# Patient Record
Sex: Female | Born: 1945 | Race: Black or African American | Hispanic: No | Smoking: Never smoker
Health system: Southern US, Community
[De-identification: ages and names within clinical notes are randomized; demographics above are authoritative.]

## PROBLEM LIST (undated history)

## (undated) DIAGNOSIS — C50919 Malignant neoplasm of unspecified site of unspecified female breast: Secondary | ICD-10-CM

## (undated) DIAGNOSIS — I10 Essential (primary) hypertension: Secondary | ICD-10-CM

## (undated) DIAGNOSIS — Z9221 Personal history of antineoplastic chemotherapy: Secondary | ICD-10-CM

## (undated) DIAGNOSIS — Z923 Personal history of irradiation: Secondary | ICD-10-CM

## (undated) DIAGNOSIS — K5790 Diverticulosis of intestine, part unspecified, without perforation or abscess without bleeding: Secondary | ICD-10-CM

## (undated) DIAGNOSIS — E7800 Pure hypercholesterolemia, unspecified: Secondary | ICD-10-CM

## (undated) DIAGNOSIS — M1990 Unspecified osteoarthritis, unspecified site: Secondary | ICD-10-CM

## (undated) DIAGNOSIS — B351 Tinea unguium: Secondary | ICD-10-CM

## (undated) DIAGNOSIS — C801 Malignant (primary) neoplasm, unspecified: Secondary | ICD-10-CM

## (undated) DIAGNOSIS — D649 Anemia, unspecified: Secondary | ICD-10-CM

## (undated) DIAGNOSIS — E119 Type 2 diabetes mellitus without complications: Secondary | ICD-10-CM

## (undated) HISTORY — PX: ABDOMINAL HYSTERECTOMY: SHX81

## (undated) HISTORY — PX: DILATION AND CURETTAGE, DIAGNOSTIC / THERAPEUTIC: SUR384

---

## 1994-11-01 DIAGNOSIS — Z923 Personal history of irradiation: Secondary | ICD-10-CM

## 1994-11-01 DIAGNOSIS — C801 Malignant (primary) neoplasm, unspecified: Secondary | ICD-10-CM

## 1994-11-01 HISTORY — DX: Personal history of irradiation: Z92.3

## 1994-11-01 HISTORY — PX: MASTECTOMY: SHX3

## 1994-11-01 HISTORY — DX: Malignant (primary) neoplasm, unspecified: C80.1

## 2004-09-23 ENCOUNTER — Ambulatory Visit: Payer: Self-pay | Admitting: Unknown Physician Specialty

## 2004-11-20 ENCOUNTER — Ambulatory Visit: Payer: Self-pay | Admitting: Gastroenterology

## 2005-05-18 ENCOUNTER — Ambulatory Visit: Payer: Self-pay | Admitting: Unknown Physician Specialty

## 2005-09-27 ENCOUNTER — Ambulatory Visit: Payer: Self-pay | Admitting: Unknown Physician Specialty

## 2006-10-03 ENCOUNTER — Ambulatory Visit: Payer: Self-pay | Admitting: Unknown Physician Specialty

## 2007-01-12 ENCOUNTER — Ambulatory Visit: Payer: Self-pay | Admitting: Unknown Physician Specialty

## 2007-06-14 ENCOUNTER — Ambulatory Visit: Payer: Self-pay | Admitting: Obstetrics and Gynecology

## 2007-06-20 ENCOUNTER — Ambulatory Visit: Payer: Self-pay | Admitting: Obstetrics and Gynecology

## 2007-10-18 ENCOUNTER — Ambulatory Visit: Payer: Self-pay | Admitting: Unknown Physician Specialty

## 2008-10-18 ENCOUNTER — Ambulatory Visit: Payer: Self-pay | Admitting: Unknown Physician Specialty

## 2009-09-04 ENCOUNTER — Ambulatory Visit: Payer: Self-pay | Admitting: Unknown Physician Specialty

## 2010-09-07 ENCOUNTER — Ambulatory Visit: Payer: Self-pay | Admitting: Unknown Physician Specialty

## 2011-09-09 ENCOUNTER — Ambulatory Visit: Payer: Self-pay | Admitting: Unknown Physician Specialty

## 2012-09-12 ENCOUNTER — Ambulatory Visit: Payer: Self-pay | Admitting: Unknown Physician Specialty

## 2012-09-25 ENCOUNTER — Ambulatory Visit: Payer: Self-pay | Admitting: Unknown Physician Specialty

## 2012-10-01 ENCOUNTER — Ambulatory Visit: Payer: Self-pay | Admitting: Oncology

## 2012-10-12 ENCOUNTER — Ambulatory Visit: Payer: Self-pay | Admitting: Surgery

## 2012-10-18 ENCOUNTER — Ambulatory Visit: Payer: Self-pay | Admitting: Oncology

## 2012-10-23 ENCOUNTER — Ambulatory Visit: Payer: Self-pay | Admitting: Oncology

## 2012-11-01 ENCOUNTER — Ambulatory Visit: Payer: Self-pay | Admitting: Oncology

## 2012-11-01 DIAGNOSIS — C50919 Malignant neoplasm of unspecified site of unspecified female breast: Secondary | ICD-10-CM

## 2012-11-01 DIAGNOSIS — Z9221 Personal history of antineoplastic chemotherapy: Secondary | ICD-10-CM

## 2012-11-01 HISTORY — DX: Malignant neoplasm of unspecified site of unspecified female breast: C50.919

## 2012-11-01 HISTORY — DX: Personal history of antineoplastic chemotherapy: Z92.21

## 2012-11-01 HISTORY — PX: MASTECTOMY: SHX3

## 2012-11-08 ENCOUNTER — Ambulatory Visit: Payer: Self-pay | Admitting: Surgery

## 2012-11-08 LAB — HEMOGLOBIN: HGB: 11.7 g/dL — ABNORMAL LOW (ref 12.0–16.0)

## 2012-11-15 ENCOUNTER — Ambulatory Visit: Payer: Self-pay | Admitting: Surgery

## 2012-11-17 LAB — PATHOLOGY REPORT

## 2012-12-02 ENCOUNTER — Ambulatory Visit: Payer: Self-pay | Admitting: Oncology

## 2012-12-30 ENCOUNTER — Ambulatory Visit: Payer: Self-pay | Admitting: Oncology

## 2013-01-09 ENCOUNTER — Ambulatory Visit: Payer: Self-pay | Admitting: Surgery

## 2013-01-11 LAB — COMPREHENSIVE METABOLIC PANEL
Albumin: 3.7 g/dL (ref 3.4–5.0)
BUN: 20 mg/dL — ABNORMAL HIGH (ref 7–18)
Bilirubin,Total: 0.5 mg/dL (ref 0.2–1.0)
Chloride: 102 mmol/L (ref 98–107)
Creatinine: 1.29 mg/dL (ref 0.60–1.30)
Glucose: 150 mg/dL — ABNORMAL HIGH (ref 65–99)
SGPT (ALT): 38 U/L (ref 12–78)
Sodium: 141 mmol/L (ref 136–145)
Total Protein: 7.2 g/dL (ref 6.4–8.2)

## 2013-01-11 LAB — CBC CANCER CENTER
Basophil #: 0 x10 3/mm (ref 0.0–0.1)
Eosinophil %: 1.5 %
HGB: 11.6 g/dL — ABNORMAL LOW (ref 12.0–16.0)
Lymphocyte #: 2.8 x10 3/mm (ref 1.0–3.6)
Lymphocyte %: 39.7 %
MCH: 21.2 pg — ABNORMAL LOW (ref 26.0–34.0)
MCHC: 31.2 g/dL — ABNORMAL LOW (ref 32.0–36.0)
MCV: 68 fL — ABNORMAL LOW (ref 80–100)
Monocyte #: 0.6 x10 3/mm (ref 0.2–0.9)
RBC: 5.49 10*6/uL — ABNORMAL HIGH (ref 3.80–5.20)
RDW: 14.6 % — ABNORMAL HIGH (ref 11.5–14.5)

## 2013-01-18 LAB — CBC CANCER CENTER
Basophil #: 0.1 "x10 3/mm "
Basophil %: 0.8 %
Eosinophil #: 0.1 "x10 3/mm "
Eosinophil %: 0.7 %
HCT: 35.1 %
HGB: 10.9 g/dL — ABNORMAL LOW
Lymphocyte %: 17.2 %
Lymphs Abs: 1.7 "x10 3/mm "
MCH: 20.8 pg — ABNORMAL LOW
MCHC: 31.1 g/dL — ABNORMAL LOW
MCV: 67 fL — ABNORMAL LOW
Monocyte #: 0.9 "x10 3/mm "
Monocyte %: 8.9 %
Neutrophil #: 7.4 "x10 3/mm " — ABNORMAL HIGH
Neutrophil %: 72.4 %
Platelet: 290 "x10 3/mm "
RBC: 5.24 "x10 6/mm " — ABNORMAL HIGH
RDW: 14.1 %
WBC: 10.1 "x10 3/mm "

## 2013-01-18 LAB — COMPREHENSIVE METABOLIC PANEL
Alkaline Phosphatase: 109 U/L (ref 50–136)
Anion Gap: 3 — ABNORMAL LOW (ref 7–16)
BUN: 16 mg/dL (ref 7–18)
Bilirubin,Total: 0.5 mg/dL (ref 0.2–1.0)
Chloride: 100 mmol/L (ref 98–107)
Creatinine: 0.95 mg/dL (ref 0.60–1.30)
EGFR (Non-African Amer.): >60
SGPT (ALT): 30 U/L (ref 12–78)
Sodium: 134 mmol/L — ABNORMAL LOW (ref 136–145)
Total Protein: 7.2 g/dL (ref 6.4–8.2)

## 2013-01-25 LAB — CBC CANCER CENTER
Basophil #: 0.1 "x10 3/mm "
Basophil %: 0.5 %
Eosinophil #: 0 "x10 3/mm "
Eosinophil %: 0.3 %
HCT: 36.9 %
HGB: 11.5 g/dL — ABNORMAL LOW
Lymphocyte %: 25.2 %
Lymphs Abs: 2.8 "x10 3/mm "
MCH: 20.9 pg — ABNORMAL LOW
MCHC: 31.3 g/dL — ABNORMAL LOW
MCV: 67 fL — ABNORMAL LOW
Monocyte #: 0.5 "x10 3/mm "
Monocyte %: 4.6 %
Neutrophil #: 7.8 "x10 3/mm " — ABNORMAL HIGH
Neutrophil %: 69.4 %
Platelet: 168 "x10 3/mm "
RBC: 5.53 "x10 6/mm " — ABNORMAL HIGH
RDW: 13.9 %
WBC: 11.2 "x10 3/mm " — ABNORMAL HIGH

## 2013-01-30 ENCOUNTER — Ambulatory Visit: Payer: Self-pay | Admitting: Oncology

## 2013-02-01 LAB — CBC CANCER CENTER
Basophil %: 0.9 %
Eosinophil #: 0 x10 3/mm (ref 0.0–0.7)
Eosinophil %: 0.3 %
HCT: 33.3 % — ABNORMAL LOW (ref 35.0–47.0)
HGB: 10.2 g/dL — ABNORMAL LOW (ref 12.0–16.0)
Lymphocyte %: 36.9 %
MCHC: 30.6 g/dL — ABNORMAL LOW (ref 32.0–36.0)
MCV: 68 fL — ABNORMAL LOW (ref 80–100)
Monocyte #: 0.5 x10 3/mm (ref 0.2–0.9)
Monocyte %: 8.6 %
Platelet: 378 x10 3/mm (ref 150–440)
RBC: 4.93 10*6/uL (ref 3.80–5.20)
RDW: 14.3 % (ref 11.5–14.5)

## 2013-02-01 LAB — COMPREHENSIVE METABOLIC PANEL
Albumin: 3.3 g/dL — ABNORMAL LOW (ref 3.4–5.0)
Alkaline Phosphatase: 87 U/L (ref 50–136)
BUN: 21 mg/dL — ABNORMAL HIGH (ref 7–18)
Chloride: 101 mmol/L (ref 98–107)
Creatinine: 1.28 mg/dL (ref 0.60–1.30)
EGFR (African American): 51 — ABNORMAL LOW
EGFR (Non-African Amer.): 44 — ABNORMAL LOW
Osmolality: 291 (ref 275–301)
Potassium: 3.5 mmol/L (ref 3.5–5.1)
Sodium: 140 mmol/L (ref 136–145)

## 2013-02-08 LAB — CBC CANCER CENTER
HCT: 33.4 % — ABNORMAL LOW (ref 35.0–47.0)
MCH: 20.2 pg — ABNORMAL LOW (ref 26.0–34.0)
MCV: 66 fL — ABNORMAL LOW (ref 80–100)
Metamyelocyte: 4 %
Myelocyte: 3 %
NRBC/100 WBC: 2 /100
Other Cells Blood: 2 %
Promyelocyte: 2 %
RBC: 5.03 10*6/uL (ref 3.80–5.20)
Segmented Neutrophils: 42 %
WBC: 14.9 x10 3/mm — ABNORMAL HIGH (ref 3.6–11.0)

## 2013-02-15 LAB — CBC CANCER CENTER
Basophil #: 0 x10 3/mm (ref 0.0–0.1)
Basophil %: 0.3 %
Eosinophil #: 0 x10 3/mm (ref 0.0–0.7)
HCT: 33.9 % — ABNORMAL LOW (ref 35.0–47.0)
Lymphocyte %: 20.8 %
MCHC: 31.2 g/dL — ABNORMAL LOW (ref 32.0–36.0)
MCV: 67 fL — ABNORMAL LOW (ref 80–100)
Monocyte %: 4.3 %
Neutrophil #: 7.6 x10 3/mm — ABNORMAL HIGH (ref 1.4–6.5)
Neutrophil %: 74.3 %
RBC: 5.05 10*6/uL (ref 3.80–5.20)
RDW: 14.9 % — ABNORMAL HIGH (ref 11.5–14.5)
WBC: 10.2 x10 3/mm (ref 3.6–11.0)

## 2013-02-22 LAB — COMPREHENSIVE METABOLIC PANEL
Alkaline Phosphatase: 94 U/L (ref 50–136)
Anion Gap: 11 (ref 7–16)
BUN: 14 mg/dL (ref 7–18)
Bilirubin,Total: 0.4 mg/dL (ref 0.2–1.0)
Calcium, Total: 8.9 mg/dL (ref 8.5–10.1)
Co2: 28 mmol/L (ref 21–32)
Creatinine: 1.14 mg/dL (ref 0.60–1.30)
EGFR (African American): 59 — ABNORMAL LOW
EGFR (Non-African Amer.): 51 — ABNORMAL LOW
Osmolality: 286 (ref 275–301)
Potassium: 3.3 mmol/L — ABNORMAL LOW (ref 3.5–5.1)
SGOT(AST): 12 U/L — ABNORMAL LOW (ref 15–37)
Sodium: 140 mmol/L (ref 136–145)
Total Protein: 6.4 g/dL (ref 6.4–8.2)

## 2013-02-22 LAB — CBC CANCER CENTER
Basophil %: 0.5 %
Eosinophil %: 0.3 %
HGB: 10 g/dL — ABNORMAL LOW (ref 12.0–16.0)
Lymphocyte %: 26.7 %
MCV: 67 fL — ABNORMAL LOW (ref 80–100)
Monocyte %: 8.1 %
Neutrophil #: 4.3 x10 3/mm (ref 1.4–6.5)
Neutrophil %: 64.4 %
RBC: 4.71 10*6/uL (ref 3.80–5.20)
RDW: 14.9 % — ABNORMAL HIGH (ref 11.5–14.5)
WBC: 6.6 x10 3/mm (ref 3.6–11.0)

## 2013-03-01 ENCOUNTER — Ambulatory Visit: Payer: Self-pay | Admitting: Oncology

## 2013-03-01 LAB — CBC CANCER CENTER
Eosinophil %: 0.8 %
Lymphocyte %: 18.7 %
MCHC: 30.5 g/dL — ABNORMAL LOW (ref 32.0–36.0)
MCV: 67 fL — ABNORMAL LOW (ref 80–100)
Neutrophil #: 8.4 x10 3/mm — ABNORMAL HIGH (ref 1.4–6.5)
Platelet: 362 x10 3/mm (ref 150–440)
RBC: 4.55 10*6/uL (ref 3.80–5.20)
WBC: 11.7 x10 3/mm — ABNORMAL HIGH (ref 3.6–11.0)

## 2013-03-08 LAB — CBC CANCER CENTER
Basophil #: 0 x10 3/mm (ref 0.0–0.1)
Eosinophil %: 0.4 %
Lymphocyte #: 2.2 x10 3/mm (ref 1.0–3.6)
Lymphocyte %: 17.7 %
MCH: 20.7 pg — ABNORMAL LOW (ref 26.0–34.0)
MCV: 68 fL — ABNORMAL LOW (ref 80–100)
Monocyte #: 0.5 x10 3/mm (ref 0.2–0.9)
Monocyte %: 4.2 %
Neutrophil #: 9.8 x10 3/mm — ABNORMAL HIGH (ref 1.4–6.5)
Neutrophil %: 77.4 %
RBC: 4.83 10*6/uL (ref 3.80–5.20)
WBC: 12.7 x10 3/mm — ABNORMAL HIGH (ref 3.6–11.0)

## 2013-03-15 LAB — CBC CANCER CENTER
Basophil #: 0 x10 3/mm (ref 0.0–0.1)
Basophil %: 0.7 %
Eosinophil #: 0 x10 3/mm (ref 0.0–0.7)
Eosinophil %: 0.6 %
HCT: 31.2 % — ABNORMAL LOW (ref 35.0–47.0)
HGB: 9.8 g/dL — ABNORMAL LOW (ref 12.0–16.0)
Lymphocyte #: 1.7 x10 3/mm (ref 1.0–3.6)
Lymphocyte %: 23.2 %
MCH: 21.2 pg — ABNORMAL LOW (ref 26.0–34.0)
MCV: 68 fL — ABNORMAL LOW (ref 80–100)
Monocyte %: 10.4 %
Neutrophil #: 4.9 x10 3/mm (ref 1.4–6.5)
Neutrophil %: 65.1 %
Platelet: 300 x10 3/mm (ref 150–440)
RBC: 4.62 10*6/uL (ref 3.80–5.20)
RDW: 16.7 % — ABNORMAL HIGH (ref 11.5–14.5)
WBC: 7.5 x10 3/mm (ref 3.6–11.0)

## 2013-03-15 LAB — COMPREHENSIVE METABOLIC PANEL
Albumin: 3.4 g/dL (ref 3.4–5.0)
Alkaline Phosphatase: 103 U/L (ref 50–136)
Anion Gap: 9 (ref 7–16)
BUN: 14 mg/dL (ref 7–18)
Chloride: 102 mmol/L (ref 98–107)
Co2: 30 mmol/L (ref 21–32)
Creatinine: 1.04 mg/dL (ref 0.60–1.30)
Glucose: 121 mg/dL — ABNORMAL HIGH (ref 65–99)
Potassium: 3.6 mmol/L (ref 3.5–5.1)
SGOT(AST): 11 U/L — ABNORMAL LOW (ref 15–37)
SGPT (ALT): 22 U/L (ref 12–78)
Sodium: 141 mmol/L (ref 136–145)
Total Protein: 6.8 g/dL (ref 6.4–8.2)

## 2013-03-20 LAB — CBC CANCER CENTER
Basophil %: 0.6 %
Eosinophil %: 0.5 %
HCT: 29.9 % — ABNORMAL LOW (ref 35.0–47.0)
HGB: 9.4 g/dL — ABNORMAL LOW (ref 12.0–16.0)
Lymphocyte %: 13.7 %
MCH: 21.1 pg — ABNORMAL LOW (ref 26.0–34.0)
MCV: 67 fL — ABNORMAL LOW (ref 80–100)
Monocyte #: 0.4 x10 3/mm (ref 0.2–0.9)
Monocyte %: 3.9 %
Neutrophil #: 7.4 x10 3/mm — ABNORMAL HIGH (ref 1.4–6.5)
Neutrophil %: 81.3 %
Platelet: 292 x10 3/mm (ref 150–440)
RBC: 4.47 10*6/uL (ref 3.80–5.20)
WBC: 9.1 x10 3/mm (ref 3.6–11.0)

## 2013-03-29 LAB — CBC CANCER CENTER
Basophil #: 0.1 x10 3/mm (ref 0.0–0.1)
Basophil %: 0.6 %
Eosinophil #: 0.1 x10 3/mm (ref 0.0–0.7)
Eosinophil %: 0.4 %
HCT: 31 % — ABNORMAL LOW (ref 35.0–47.0)
HGB: 9.5 g/dL — ABNORMAL LOW (ref 12.0–16.0)
Lymphocyte #: 2.3 x10 3/mm (ref 1.0–3.6)
Lymphocyte %: 19.1 %
MCV: 68 fL — ABNORMAL LOW (ref 80–100)
Monocyte #: 0.7 x10 3/mm (ref 0.2–0.9)
Monocyte %: 6 %
Neutrophil #: 8.9 x10 3/mm — ABNORMAL HIGH (ref 1.4–6.5)
Neutrophil %: 73.9 %
RBC: 4.59 10*6/uL (ref 3.80–5.20)
RDW: 17.9 % — ABNORMAL HIGH (ref 11.5–14.5)

## 2013-04-01 ENCOUNTER — Ambulatory Visit: Payer: Self-pay | Admitting: Oncology

## 2013-04-05 LAB — CBC CANCER CENTER
Basophil #: 0.1 x10 3/mm (ref 0.0–0.1)
Basophil %: 1.1 %
Eosinophil #: 0 x10 3/mm (ref 0.0–0.7)
Eosinophil %: 0.5 %
HCT: 29.1 % — ABNORMAL LOW (ref 35.0–47.0)
HGB: 9.2 g/dL — ABNORMAL LOW (ref 12.0–16.0)
MCV: 68 fL — ABNORMAL LOW (ref 80–100)
Neutrophil #: 4.5 x10 3/mm (ref 1.4–6.5)
RBC: 4.3 10*6/uL (ref 3.80–5.20)
WBC: 7.3 x10 3/mm (ref 3.6–11.0)

## 2013-04-05 LAB — COMPREHENSIVE METABOLIC PANEL
Albumin: 3.3 g/dL — ABNORMAL LOW (ref 3.4–5.0)
Alkaline Phosphatase: 119 U/L (ref 50–136)
Anion Gap: 12 (ref 7–16)
BUN: 18 mg/dL (ref 7–18)
Bilirubin,Total: 0.3 mg/dL (ref 0.2–1.0)
Co2: 27 mmol/L (ref 21–32)
Creatinine: 1.11 mg/dL (ref 0.60–1.30)
EGFR (Non-African Amer.): 52 — ABNORMAL LOW
Osmolality: 280 (ref 275–301)
Potassium: 3.4 mmol/L — ABNORMAL LOW (ref 3.5–5.1)
SGOT(AST): 13 U/L — ABNORMAL LOW (ref 15–37)
Sodium: 138 mmol/L (ref 136–145)
Total Protein: 6.9 g/dL (ref 6.4–8.2)

## 2013-04-26 LAB — CBC CANCER CENTER
Basophil #: 0.1 x10 3/mm (ref 0.0–0.1)
Eosinophil #: 0 x10 3/mm (ref 0.0–0.7)
HGB: 9.6 g/dL — ABNORMAL LOW (ref 12.0–16.0)
Lymphocyte #: 2.5 x10 3/mm (ref 1.0–3.6)
MCHC: 31.4 g/dL — ABNORMAL LOW (ref 32.0–36.0)
Monocyte %: 12.2 %
Neutrophil %: 56.8 %

## 2013-04-26 LAB — COMPREHENSIVE METABOLIC PANEL
Albumin: 3.3 g/dL — ABNORMAL LOW (ref 3.4–5.0)
Alkaline Phosphatase: 119 U/L (ref 50–136)
Anion Gap: 9 (ref 7–16)
BUN: 17 mg/dL (ref 7–18)
Bilirubin,Total: 0.5 mg/dL (ref 0.2–1.0)
Calcium, Total: 9.7 mg/dL (ref 8.5–10.1)
Chloride: 103 mmol/L (ref 98–107)
Co2: 28 mmol/L (ref 21–32)
EGFR (African American): >60
EGFR (Non-African Amer.): 57 — ABNORMAL LOW
Glucose: 121 mg/dL — ABNORMAL HIGH (ref 65–99)
Osmolality: 282 (ref 275–301)
Potassium: 3.9 mmol/L (ref 3.5–5.1)
SGOT(AST): 13 U/L — ABNORMAL LOW (ref 15–37)
Sodium: 140 mmol/L (ref 136–145)
Total Protein: 6.9 g/dL (ref 6.4–8.2)

## 2013-05-01 ENCOUNTER — Ambulatory Visit: Payer: Self-pay | Admitting: Oncology

## 2013-06-01 ENCOUNTER — Ambulatory Visit: Payer: Self-pay | Admitting: Oncology

## 2013-07-02 ENCOUNTER — Ambulatory Visit: Payer: Self-pay | Admitting: Oncology

## 2013-07-30 LAB — CBC CANCER CENTER
Basophil #: 0 x10 3/mm (ref 0.0–0.1)
Basophil %: 0.8 %
Lymphocyte #: 2.1 x10 3/mm (ref 1.0–3.6)
Lymphocyte %: 41.1 %
MCH: 21.1 pg — ABNORMAL LOW (ref 26.0–34.0)
MCHC: 31.2 g/dL — ABNORMAL LOW (ref 32.0–36.0)
MCV: 68 fL — ABNORMAL LOW (ref 80–100)
Monocyte #: 0.4 x10 3/mm (ref 0.2–0.9)
Monocyte %: 7.5 %
Neutrophil #: 2.4 x10 3/mm (ref 1.4–6.5)
Neutrophil %: 48.1 %
Platelet: 346 x10 3/mm (ref 150–440)
RBC: 5.51 10*6/uL — ABNORMAL HIGH (ref 3.80–5.20)
WBC: 5 x10 3/mm (ref 3.6–11.0)

## 2013-07-30 LAB — COMPREHENSIVE METABOLIC PANEL
Albumin: 3.8 g/dL (ref 3.4–5.0)
Alkaline Phosphatase: 105 U/L (ref 50–136)
BUN: 20 mg/dL — ABNORMAL HIGH (ref 7–18)
Calcium, Total: 10.1 mg/dL (ref 8.5–10.1)
Co2: 32 mmol/L (ref 21–32)
Creatinine: 1.07 mg/dL (ref 0.60–1.30)
EGFR (African American): >60
Glucose: 130 mg/dL — ABNORMAL HIGH (ref 65–99)
Potassium: 3.9 mmol/L (ref 3.5–5.1)
SGOT(AST): 19 U/L (ref 15–37)
Sodium: 142 mmol/L (ref 136–145)
Total Protein: 7.2 g/dL (ref 6.4–8.2)

## 2013-08-01 ENCOUNTER — Ambulatory Visit: Payer: Self-pay | Admitting: Oncology

## 2013-09-10 ENCOUNTER — Ambulatory Visit: Payer: Self-pay | Admitting: Oncology

## 2013-09-13 ENCOUNTER — Ambulatory Visit: Payer: Self-pay | Admitting: Surgery

## 2013-10-01 ENCOUNTER — Ambulatory Visit: Payer: Self-pay | Admitting: Oncology

## 2013-11-05 ENCOUNTER — Ambulatory Visit: Payer: Self-pay | Admitting: Oncology

## 2013-11-05 LAB — CBC CANCER CENTER
BASOS ABS: 0 x10 3/mm (ref 0.0–0.1)
Basophil %: 0.6 %
Eosinophil #: 0.2 x10 3/mm (ref 0.0–0.7)
Eosinophil %: 3.3 %
HCT: 37.1 % (ref 35.0–47.0)
HGB: 11.4 g/dL — ABNORMAL LOW (ref 12.0–16.0)
LYMPHS ABS: 2.3 x10 3/mm (ref 1.0–3.6)
LYMPHS PCT: 38.5 %
MCH: 20.8 pg — AB (ref 26.0–34.0)
MCHC: 30.8 g/dL — AB (ref 32.0–36.0)
MCV: 67 fL — ABNORMAL LOW (ref 80–100)
MONO ABS: 0.4 x10 3/mm (ref 0.2–0.9)
Monocyte %: 7.1 %
NEUTROS ABS: 3 x10 3/mm (ref 1.4–6.5)
NEUTROS PCT: 50.5 %
PLATELETS: 383 x10 3/mm (ref 150–440)
RBC: 5.51 10*6/uL — ABNORMAL HIGH (ref 3.80–5.20)
RDW: 16.6 % — AB (ref 11.5–14.5)
WBC: 5.9 x10 3/mm (ref 3.6–11.0)

## 2013-11-05 LAB — COMPREHENSIVE METABOLIC PANEL
ANION GAP: 10 (ref 7–16)
Albumin: 3.6 g/dL (ref 3.4–5.0)
Alkaline Phosphatase: 111 U/L
BUN: 16 mg/dL (ref 7–18)
Bilirubin,Total: 0.5 mg/dL (ref 0.2–1.0)
CALCIUM: 9.7 mg/dL (ref 8.5–10.1)
CHLORIDE: 101 mmol/L (ref 98–107)
CREATININE: 1.12 mg/dL (ref 0.60–1.30)
Co2: 28 mmol/L (ref 21–32)
EGFR (African American): 60 — ABNORMAL LOW
EGFR (Non-African Amer.): 52 — ABNORMAL LOW
Glucose: 228 mg/dL — ABNORMAL HIGH (ref 65–99)
OSMOLALITY: 286 (ref 275–301)
POTASSIUM: 3.9 mmol/L (ref 3.5–5.1)
SGOT(AST): 14 U/L — ABNORMAL LOW (ref 15–37)
SGPT (ALT): 43 U/L (ref 12–78)
Sodium: 139 mmol/L (ref 136–145)
TOTAL PROTEIN: 7.1 g/dL (ref 6.4–8.2)

## 2013-11-06 LAB — CANCER ANTIGEN 27.29: CA 27.29: 14.3 U/mL (ref 0.0–38.6)

## 2013-12-02 ENCOUNTER — Ambulatory Visit: Payer: Self-pay | Admitting: Oncology

## 2013-12-30 ENCOUNTER — Ambulatory Visit: Payer: Self-pay | Admitting: Oncology

## 2014-01-31 ENCOUNTER — Ambulatory Visit: Payer: Self-pay | Admitting: Oncology

## 2014-03-01 ENCOUNTER — Ambulatory Visit: Payer: Self-pay | Admitting: Oncology

## 2014-04-01 ENCOUNTER — Ambulatory Visit: Payer: Self-pay | Admitting: Oncology

## 2014-05-01 ENCOUNTER — Ambulatory Visit: Payer: Self-pay | Admitting: Oncology

## 2014-05-01 ENCOUNTER — Ambulatory Visit: Payer: Self-pay | Admitting: Hematology and Oncology

## 2014-05-06 LAB — CBC CANCER CENTER
BASOS PCT: 0.3 %
Basophil #: 0 x10 3/mm (ref 0.0–0.1)
EOS PCT: 1.6 %
Eosinophil #: 0.1 x10 3/mm (ref 0.0–0.7)
HCT: 36.8 % (ref 35.0–47.0)
HGB: 11.4 g/dL — ABNORMAL LOW (ref 12.0–16.0)
LYMPHS ABS: 1.9 x10 3/mm (ref 1.0–3.6)
LYMPHS PCT: 28.2 %
MCH: 21.3 pg — ABNORMAL LOW (ref 26.0–34.0)
MCHC: 30.9 g/dL — ABNORMAL LOW (ref 32.0–36.0)
MCV: 69 fL — ABNORMAL LOW (ref 80–100)
MONOS PCT: 5.5 %
Monocyte #: 0.4 x10 3/mm (ref 0.2–0.9)
NEUTROS PCT: 64.4 %
Neutrophil #: 4.4 x10 3/mm (ref 1.4–6.5)
Platelet: 322 x10 3/mm (ref 150–440)
RBC: 5.33 10*6/uL — ABNORMAL HIGH (ref 3.80–5.20)
RDW: 15.1 % — ABNORMAL HIGH (ref 11.5–14.5)
WBC: 6.9 x10 3/mm (ref 3.6–11.0)

## 2014-05-06 LAB — COMPREHENSIVE METABOLIC PANEL
ANION GAP: 7 (ref 7–16)
Albumin: 3.7 g/dL (ref 3.4–5.0)
Alkaline Phosphatase: 102 U/L
BUN: 21 mg/dL — AB (ref 7–18)
Bilirubin,Total: 0.7 mg/dL (ref 0.2–1.0)
CALCIUM: 9.7 mg/dL (ref 8.5–10.1)
CHLORIDE: 104 mmol/L (ref 98–107)
Co2: 30 mmol/L (ref 21–32)
Creatinine: 1.04 mg/dL (ref 0.60–1.30)
EGFR (African American): >60
GFR CALC NON AF AMER: 56 — AB
GLUCOSE: 184 mg/dL — AB (ref 65–99)
Osmolality: 289 (ref 275–301)
POTASSIUM: 4.4 mmol/L (ref 3.5–5.1)
SGOT(AST): 21 U/L (ref 15–37)
SGPT (ALT): 63 U/L (ref 12–78)
Sodium: 141 mmol/L (ref 136–145)
Total Protein: 7.3 g/dL (ref 6.4–8.2)

## 2014-06-01 ENCOUNTER — Ambulatory Visit: Payer: Self-pay | Admitting: Hematology and Oncology

## 2014-06-01 ENCOUNTER — Ambulatory Visit: Payer: Self-pay | Admitting: Oncology

## 2014-07-02 ENCOUNTER — Ambulatory Visit: Payer: Self-pay | Admitting: Oncology

## 2014-07-31 DIAGNOSIS — R740 Nonspecific elevation of levels of transaminase and lactic acid dehydrogenase [LDH]: Secondary | ICD-10-CM

## 2014-07-31 DIAGNOSIS — I10 Essential (primary) hypertension: Secondary | ICD-10-CM | POA: Insufficient documentation

## 2014-07-31 DIAGNOSIS — R7401 Elevation of levels of liver transaminase levels: Secondary | ICD-10-CM | POA: Insufficient documentation

## 2014-07-31 DIAGNOSIS — IMO0002 Reserved for concepts with insufficient information to code with codable children: Secondary | ICD-10-CM | POA: Insufficient documentation

## 2014-08-01 ENCOUNTER — Ambulatory Visit: Payer: Self-pay | Admitting: Oncology

## 2014-08-06 ENCOUNTER — Ambulatory Visit: Payer: Self-pay | Admitting: Internal Medicine

## 2014-09-03 ENCOUNTER — Ambulatory Visit: Payer: Self-pay | Admitting: Oncology

## 2014-09-19 ENCOUNTER — Ambulatory Visit: Payer: Self-pay | Admitting: Internal Medicine

## 2014-10-01 ENCOUNTER — Ambulatory Visit: Payer: Self-pay | Admitting: Oncology

## 2014-11-01 ENCOUNTER — Ambulatory Visit: Payer: Self-pay | Admitting: Oncology

## 2014-12-12 DIAGNOSIS — E782 Mixed hyperlipidemia: Secondary | ICD-10-CM | POA: Insufficient documentation

## 2015-01-07 ENCOUNTER — Ambulatory Visit
Admit: 2015-01-07 | Disposition: A | Discharge status: Home / Self Care | Payer: Self-pay | Attending: Oncology | Admitting: Oncology

## 2015-01-31 ENCOUNTER — Ambulatory Visit
Admit: 2015-01-31 | Disposition: A | Discharge status: Home / Self Care | Payer: Self-pay | Attending: Oncology | Admitting: Oncology

## 2015-02-21 NOTE — Op Note (Signed)
PATIENT NAME:  Nancy Gaines, Nancy Gaines MR#:  [id number] DATE OF BIRTH:  09/29/1946  DATE OF PROCEDURE:  01/09/2013  PREOPERATIVE DIAGNOSIS:  Breast cancer.   POSTOPERATIVE DIAGNOSIS:  Breast cancer.   PROCEDURE PERFORMED:  Insertion of central venous catheter with subcutaneous infusion port.   SURGEON:  Rochel Brome  ANESTHESIA:  Local 1% Xylocaine with monitored anesthesia care.   INDICATIONS:  This 69 year old female has history of cancer of the right breast, now needing central venous access for chemotherapy. The patient was placed on the operating table in the supine position and sedated. A rolled sheet was placed behind her shoulder blades. The neck was extended. The neck and left subclavian areas were prepared with ChloraPrep, draped in a sterile manner.   The skin beneath the clavicle was infiltrated with 1% Xylocaine. A transversely oriented 3 cm incision was made, carried down through subcutaneous tissues and made a subcutaneous pouch inferior to the incision large enough to admit the port.   The ultrasound was used to demonstrate the left jugular vein and carotid and thyroid lobe. The skin overlying the jugular vein was infiltrated with 1% Xylocaine. A transversely oriented 6 mm incision was made, carried down through subcutaneous tissues.  Next, with the patient in the Trendelenburg position using ultrasound guidance, a needle was inserted into the jugular vein; however, at first, the catheter would not thread and had to make additional sticks and subsequently threaded the catheter down into the central circulation. The patient did have some momentary ventricular ectopy but then the catheter was pulled back and the ectopy resolved. The ultrasound image of the vein was recorded for the paper chart.  Next, fluoroscopy was used to demonstrate the location of the wire in the vena cava. The dilator and introducer sheath were advanced over the guidewire. The dilator and guidewire were removed. The  catheter was advanced down through the sheath and the sheath was pulled away. The tip of the catheter was positioned in the superior vena cava as demonstrated with fluoroscopy and a fluoroscopic image was saved for the paper chart.  Next, the catheter was tunneled down to the subclavian incision and cut to fit and attached to the PFM port with the accompanying sleeve. The port was accessed and aspirated a trace of blood and flushed with heparinized saline solution. The port was placed into the subcutaneous pouch and sutured to the surrounding fat with 3-0 silk.  Next, the pouch was closed with 5-0 Vicryl sutures. Both skin incisions were closed with 5-0 Vicryl subcuticular suture and Dermabond. The patient tolerated the procedure satisfactorily and was then prepared for transfer to the recovery room.    ____________________________ Lenna Sciara. Rochel Brome, MD jws:ce D: 01/09/2013 16:52:40 ET T: 01/09/2013 17:31:18 ET JOB#: [id number]  cc: Loreli Dollar, MD, <Dictator> Loreli Dollar MD ELECTRONICALLY SIGNED 01/09/2013 19:07

## 2015-02-21 NOTE — Op Note (Signed)
PATIENT NAME:  Nancy Gaines, Nancy Gaines MR#:  [id number] DATE OF BIRTH:  23-Mar-1946  DATE OF PROCEDURE:  11/15/2012  PREOPERATIVE DIAGNOSIS: Carcinoma of the right breast.   POSTOPERATIVE DIAGNOSIS: Carcinoma of the right breast.   PROCEDURE: Right simple mastectomy.   ANESTHESIA: General.   INDICATIONS: This 69 year old female has a past history of carcinoma of the right breast with partial mastectomy, sentinel lymph node biopsy and radiation therapy. Recently had a mammogram depicting a mass in the upper central aspect of right breast. Biopsy demonstrated infiltrating carcinoma and surgery was recommended for definitive treatment. She did have a preoperative injection of radioactive technetium sulfur colloid. I reviewed the scan, which demonstrated some activity adjacent to the shield but did not positively identify an axillary node.   DESCRIPTION OF PROCEDURE: The patient was placed on the operating table in the supine position under general anesthesia. Both arms were placed on a lateral arm rest. The right breast and surrounding chest wall and upper arm were prepared with Chloraprep and draped in a sterile manner.   Initial inspection revealed there was no palpable mass in the axilla. Gamma counter was used in the axilla and did not identify any axillary activity. Next, a curvilinear incision was made from inferior medial to superior lateral above and below the breast. The dissection was carried down into subcutaneous tissues. Numerous small bleeding points were cauterized. 3-0 silk sutures were used for traction of the skin and subcutaneous flaps. Flaps were raised superiorly in the direction of the clavicle, medially to the sternum, laterally to the latissimus dorsi muscle and inferiorly to the inframammary fold. Next, the breast was elevated off the underlying deep fascia with electrocautery. Several vessels were suture ligated with 4-0 chromic. The dissection was carried out to include the axillary  tail. Next, the gamma probe was again used in the axilla and demonstrated no radioactivity in the axilla. The axilla was further palpated and identified no palpable mass, no adenopathy. The simple mastectomy was completed and specimen submitted for routine pathology. The wound was inspected. Several small bleeding points were cauterized. Hemostasis was subsequently intact. Next, 2 Blake drains were inserted and brought out through separate inferior stab wounds. One was placed up into the axilla and one along the anterior chest wall. These drains were secured to the skin with 3-0 nylon suture. There was some mild redundancy of skin remaining, but it did not appear appropriate to remove any wedges of skin. Concern is irradiated tissue. Next, the wound was closed with a running 4-0 Monocryl subcuticular suture and Dermabond, which was allowed to dry, then the drains were activated. The drain sites were dressed with benzoin and Tegaderm. The drains were also secured with benzoin and 2-inch silk tape. The drains were activated. There was minimal serosanguineous drainage.   The patient tolerated the procedure satisfactorily and was then moved to the recovery room for postoperative care.   ____________________________ J. Rochel Brome, MD jws:jm D: 11/15/2012 14:56:03 ET T: 11/15/2012 18:49:10 ET JOB#: [id number]  cc: Loreli Dollar, MD, <Dictator> Loreli Dollar MD ELECTRONICALLY SIGNED 11/17/2012 16:56

## 2015-03-13 DIAGNOSIS — IMO0002 Reserved for concepts with insufficient information to code with codable children: Secondary | ICD-10-CM | POA: Insufficient documentation

## 2015-03-13 DIAGNOSIS — E119 Type 2 diabetes mellitus without complications: Secondary | ICD-10-CM | POA: Insufficient documentation

## 2015-03-13 DIAGNOSIS — E1165 Type 2 diabetes mellitus with hyperglycemia: Secondary | ICD-10-CM | POA: Insufficient documentation

## 2015-03-14 ENCOUNTER — Other Ambulatory Visit: Payer: Self-pay | Admitting: Internal Medicine

## 2015-03-14 DIAGNOSIS — R748 Abnormal levels of other serum enzymes: Secondary | ICD-10-CM

## 2015-03-19 ENCOUNTER — Ambulatory Visit
Admission: RE | Admit: 2015-03-19 | Discharge: 2015-03-19 | Disposition: A | Discharge status: Home / Self Care | Payer: Medicare Other | Source: Ambulatory Visit | Attending: Internal Medicine | Admitting: Internal Medicine

## 2015-03-19 DIAGNOSIS — R748 Abnormal levels of other serum enzymes: Secondary | ICD-10-CM | POA: Diagnosis present

## 2015-03-19 DIAGNOSIS — R160 Hepatomegaly, not elsewhere classified: Secondary | ICD-10-CM | POA: Diagnosis not present

## 2015-04-01 ENCOUNTER — Telehealth: Payer: Self-pay | Admitting: *Deleted

## 2015-04-01 ENCOUNTER — Inpatient Hospital Stay: Payer: Medicare Other | Attending: Oncology

## 2015-04-01 DIAGNOSIS — C50919 Malignant neoplasm of unspecified site of unspecified female breast: Secondary | ICD-10-CM | POA: Insufficient documentation

## 2015-04-01 DIAGNOSIS — C801 Malignant (primary) neoplasm, unspecified: Secondary | ICD-10-CM

## 2015-04-01 DIAGNOSIS — Z452 Encounter for adjustment and management of vascular access device: Secondary | ICD-10-CM | POA: Insufficient documentation

## 2015-04-01 MED ORDER — SODIUM CHLORIDE 0.9 % IJ SOLN
10.0000 mL | INTRAMUSCULAR | Status: DC | PRN
  Administered 2015-04-01: 10 mL via INTRAVENOUS
  Filled 2015-04-01: qty 10

## 2015-04-01 MED ORDER — LIDOCAINE-PRILOCAINE 2.5-2.5 % EX CREA: 1.0000 "application " | TOPICAL_CREAM | CUTANEOUS | Status: DC | PRN

## 2015-04-01 MED ORDER — HEPARIN SOD (PORK) LOCK FLUSH 100 UNIT/ML IV SOLN
500.0000 [IU] | Freq: Once | INTRAVENOUS | Status: AC
  Administered 2015-04-01: 500 [IU] via INTRAVENOUS
  Filled 2015-04-01: qty 5

## 2015-04-01 NOTE — Telephone Encounter (Signed)
Prescription for EMLA cream called into pharmacy.

## 2015-04-16 ENCOUNTER — Other Ambulatory Visit: Payer: Self-pay | Admitting: Oncology

## 2015-05-13 ENCOUNTER — Inpatient Hospital Stay: Payer: Medicare Other | Attending: Oncology

## 2015-05-13 DIAGNOSIS — Z170 Estrogen receptor positive status [ER+]: Secondary | ICD-10-CM | POA: Insufficient documentation

## 2015-05-13 DIAGNOSIS — C50911 Malignant neoplasm of unspecified site of right female breast: Secondary | ICD-10-CM | POA: Insufficient documentation

## 2015-05-13 DIAGNOSIS — C801 Malignant (primary) neoplasm, unspecified: Secondary | ICD-10-CM

## 2015-05-13 DIAGNOSIS — Z452 Encounter for adjustment and management of vascular access device: Secondary | ICD-10-CM | POA: Insufficient documentation

## 2015-05-13 MED ORDER — SODIUM CHLORIDE 0.9 % IJ SOLN
10.0000 mL | INTRAMUSCULAR | Status: DC | PRN
  Administered 2015-05-13: 10 mL via INTRAVENOUS
  Filled 2015-05-13: qty 10

## 2015-05-13 MED ORDER — HEPARIN SOD (PORK) LOCK FLUSH 100 UNIT/ML IV SOLN
500.0000 [IU] | Freq: Once | INTRAVENOUS | Status: AC
  Administered 2015-05-13: 500 [IU] via INTRAVENOUS
  Filled 2015-05-13: qty 5

## 2015-05-21 ENCOUNTER — Other Ambulatory Visit: Payer: Self-pay | Admitting: Oncology

## 2015-06-24 ENCOUNTER — Inpatient Hospital Stay: Payer: Medicare Other | Attending: Oncology

## 2015-06-24 ENCOUNTER — Telehealth: Payer: Self-pay | Admitting: *Deleted

## 2015-06-24 ENCOUNTER — Encounter (INDEPENDENT_AMBULATORY_CARE_PROVIDER_SITE_OTHER): Payer: Self-pay

## 2015-06-24 DIAGNOSIS — Z170 Estrogen receptor positive status [ER+]: Secondary | ICD-10-CM | POA: Diagnosis not present

## 2015-06-24 DIAGNOSIS — C50919 Malignant neoplasm of unspecified site of unspecified female breast: Secondary | ICD-10-CM

## 2015-06-24 DIAGNOSIS — C801 Malignant (primary) neoplasm, unspecified: Secondary | ICD-10-CM

## 2015-06-24 DIAGNOSIS — C50911 Malignant neoplasm of unspecified site of right female breast: Secondary | ICD-10-CM | POA: Insufficient documentation

## 2015-06-24 DIAGNOSIS — Z452 Encounter for adjustment and management of vascular access device: Secondary | ICD-10-CM | POA: Diagnosis not present

## 2015-06-24 MED ORDER — HEPARIN SOD (PORK) LOCK FLUSH 100 UNIT/ML IV SOLN
500.0000 [IU] | Freq: Once | INTRAVENOUS | Status: AC
  Administered 2015-06-24: 500 [IU] via INTRAVENOUS
  Filled 2015-06-24: qty 5

## 2015-06-24 MED ORDER — SODIUM CHLORIDE 0.9 % IJ SOLN
10.0000 mL | INTRAMUSCULAR | Status: DC | PRN
  Administered 2015-06-24: 10 mL via INTRAVENOUS
  Filled 2015-06-24: qty 10

## 2015-06-24 MED ORDER — LIDOCAINE-PRILOCAINE 2.5-2.5 % EX CREA: 1.0000 "application " | TOPICAL_CREAM | CUTANEOUS | Status: DC | PRN

## 2015-06-24 NOTE — Telephone Encounter (Signed)
-----   Message from Gladwin sent at 06/24/2015  8:50 AM EDT ----- Regarding: spray for port Patient wants to know can she get a script for a numbing spray for her port....please let Tia know. Thanks!
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
## 2015-06-24 NOTE — Telephone Encounter (Signed)
Pt requests rx for emla cream. rx called into [facility] pharmacy.

## 2015-07-10 ENCOUNTER — Encounter: Payer: Self-pay | Admitting: Oncology

## 2015-07-10 ENCOUNTER — Inpatient Hospital Stay (HOSPITAL_BASED_OUTPATIENT_CLINIC_OR_DEPARTMENT_OTHER): Payer: Medicare Other | Admitting: Oncology

## 2015-07-10 ENCOUNTER — Inpatient Hospital Stay: Payer: Medicare Other | Attending: Oncology

## 2015-07-10 VITALS — BP 125/77 | HR 96 | Temp 96.3°F | Wt 217.1 lb

## 2015-07-10 DIAGNOSIS — Z9221 Personal history of antineoplastic chemotherapy: Secondary | ICD-10-CM | POA: Diagnosis not present

## 2015-07-10 DIAGNOSIS — E119 Type 2 diabetes mellitus without complications: Secondary | ICD-10-CM | POA: Diagnosis not present

## 2015-07-10 DIAGNOSIS — C50919 Malignant neoplasm of unspecified site of unspecified female breast: Secondary | ICD-10-CM

## 2015-07-10 DIAGNOSIS — Z79899 Other long term (current) drug therapy: Secondary | ICD-10-CM | POA: Diagnosis not present

## 2015-07-10 DIAGNOSIS — M8580 Other specified disorders of bone density and structure, unspecified site: Secondary | ICD-10-CM

## 2015-07-10 DIAGNOSIS — Z170 Estrogen receptor positive status [ER+]: Secondary | ICD-10-CM

## 2015-07-10 DIAGNOSIS — Z79811 Long term (current) use of aromatase inhibitors: Secondary | ICD-10-CM | POA: Insufficient documentation

## 2015-07-10 DIAGNOSIS — I10 Essential (primary) hypertension: Secondary | ICD-10-CM | POA: Diagnosis not present

## 2015-07-10 DIAGNOSIS — Z9011 Acquired absence of right breast and nipple: Secondary | ICD-10-CM

## 2015-07-10 DIAGNOSIS — Z1231 Encounter for screening mammogram for malignant neoplasm of breast: Secondary | ICD-10-CM

## 2015-07-10 DIAGNOSIS — E780 Pure hypercholesterolemia: Secondary | ICD-10-CM | POA: Diagnosis not present

## 2015-07-10 DIAGNOSIS — K760 Fatty (change of) liver, not elsewhere classified: Secondary | ICD-10-CM | POA: Diagnosis not present

## 2015-07-10 DIAGNOSIS — C50911 Malignant neoplasm of unspecified site of right female breast: Secondary | ICD-10-CM | POA: Diagnosis not present

## 2015-07-10 LAB — COMPREHENSIVE METABOLIC PANEL
ALT: 39 U/L (ref 14–54)
AST: 30 U/L (ref 15–41)
Albumin: 4.2 g/dL (ref 3.5–5.0)
Alkaline Phosphatase: 100 U/L (ref 38–126)
Anion gap: 9 (ref 5–15)
BILIRUBIN TOTAL: 0.8 mg/dL (ref 0.3–1.2)
BUN: 25 mg/dL — AB (ref 6–20)
CALCIUM: 9.6 mg/dL (ref 8.9–10.3)
CO2: 27 mmol/L (ref 22–32)
CREATININE: 1.12 mg/dL — AB (ref 0.44–1.00)
Chloride: 99 mmol/L — ABNORMAL LOW (ref 101–111)
GFR calc Af Amer: 58 mL/min — ABNORMAL LOW (ref >60)
GFR calc non Af Amer: 50 mL/min — ABNORMAL LOW (ref >60)
Glucose, Bld: 237 mg/dL — ABNORMAL HIGH (ref 65–99)
Potassium: 4 mmol/L (ref 3.5–5.1)
SODIUM: 135 mmol/L (ref 135–145)
TOTAL PROTEIN: 7.4 g/dL (ref 6.5–8.1)

## 2015-07-10 LAB — CBC WITH DIFFERENTIAL/PLATELET
Basophils Absolute: 0 10*3/uL (ref 0–0.1)
Basophils Relative: 0 %
EOS ABS: 0.1 10*3/uL (ref 0–0.7)
EOS PCT: 1 %
HCT: 39.9 % (ref 35.0–47.0)
Hemoglobin: 12.6 g/dL (ref 12.0–16.0)
LYMPHS ABS: 2.6 10*3/uL (ref 1.0–3.6)
Lymphocytes Relative: 36 %
MCH: 21.1 pg — AB (ref 26.0–34.0)
MCHC: 31.4 g/dL — ABNORMAL LOW (ref 32.0–36.0)
MCV: 67.2 fL — ABNORMAL LOW (ref 80.0–100.0)
MONO ABS: 0.4 10*3/uL (ref 0.2–0.9)
Monocytes Relative: 6 %
Neutro Abs: 4.1 10*3/uL (ref 1.4–6.5)
Neutrophils Relative %: 57 %
PLATELETS: 348 10*3/uL (ref 150–440)
RBC: 5.95 MIL/uL — ABNORMAL HIGH (ref 3.80–5.20)
RDW: 14.5 % (ref 11.5–14.5)
WBC: 7.2 10*3/uL (ref 3.6–11.0)

## 2015-07-10 NOTE — Progress Notes (Signed)
Patient does not have living will.  Never smoked. 

## 2015-07-11 LAB — CANCER ANTIGEN 27.29: CA 27.29: 27.1 U/mL (ref 0.0–38.6)

## 2015-07-12 ENCOUNTER — Encounter: Payer: Self-pay | Admitting: Oncology

## 2015-07-12 NOTE — Progress Notes (Signed)
[place] @ [facility] [phone]  Fax:([phone] [place]: 01/31/1946  MR#: [id number]  TDD#:[id number]  Patient Care Team: Glendon Axe, MD as PCP - General (Internal Medicine)  CHIEF COMPLAINT:  Chief Complaint  Patient presents with  . Follow-up   Chief Complaint/Diagnosis:   1. December of 2013, [name], [name], PR less than 5%. HER-2 receptor not over expressed. 2. History of carcinoma of breast (right breast), 1 cm lobular carcinoma of breast with 7 negative lymph node. 3. Status post lumpectomy in August of 1996 and axillary node dissection [name] patient did not get tamoxifen as an adjuvant treatment 3. BRCA negative (verbal information from [facility]) 4. Patient had a right breast mastectomy,  pT1b  p N0 M0. ER positive, PR positive.  HER-2/neu not over expressed.  5. Status post mastectomy in January of 2014.  Sentinel lymph node was not identified.  (Patient had previous lymph node dissection from prior carcinoma of breast) [name], 2014) 7. Patient was started on Cytoxan and Taxotere January 11, 2013    INTERVAL HISTORY: *69 year old lady with a history of carcinoma of breast.  (Right breast) came today further follow-up.  Patient had a mastectomy.  Taking letrozole or tolerating treatment very well.  No bony pain.  Due for next mammogram as well as bone density study.  No bony pain no bony fracture.  REVIEW OF SYSTEMS:   GENERAL:  Feels good.  Active.  No fevers, sweats or weight loss. PERFORMANCE STATUS (ECOG): 0 HEENT:  No visual changes, runny nose, sore throat, mouth sores or tenderness. Lungs: No shortness of breath or cough.  No hemoptysis. Cardiac:  No chest pain, palpitations,  orthopnea, or PND. GI:  No nausea, vomiting, diarrhea, constipation, melena or hematochezia. GU:  No urgency, frequency, dysuria, or hematuria. Musculoskeletal:  No back pain.  No joint pain.  No muscle tenderness. Extremities:  No pain or swelling. Skin:  No rashes or skin changes. Neuro:  No headache, numbness or weakness, balance or coordination issues. Endocrine:  No diabetes, thyroid issues, hot flashes or night sweats. Psych:  No mood changes, depression or anxiety. Pain:  No focal pain. Review of systems:  All other systems reviewed and found to be negative. As per HPI. Otherwise, a complete review of systems is negatve.   No Known Allergies:   Significant History/PMH:   HTN:    Diabetes Mellitus, Type II (NIDD):    Anemia:    Breast Cancer:    Dilation and Curretage:    Hysterectomy: 2008   Right Partial Mastectomy:    Right Mastectomy with SN biopsy: Jan 2014  Preventive Screening:  Has patient had any of the following test? Colonscopy  Mammography  Pap Smear (1)   Last Colonoscopy: unknown date, [facility](1)   Last Mammography: 09/2014   Last Pap Smear: Dec 2013(1)   PFSH: Comments: There is strong family history of breast cancer.  Sister had bilateral inflammatory carcinoma of breast there is family history of stomach cancer  Social History: negative alcohol, negative tobacco  Additional Past Medical and Surgical History: History of diabetes type II   Hypertension   Hypercholesterolemia   Inflammatory arthritis   ADVANCED DIRECTIVES:  Patient does not have any living  will or healthcare power of attorney.  Information was given .  Available resources had been discussed.  We will follow-up on subsequent appointments regarding this issu HEALTH MAINTENANCE: Social History  Substance Use Topics  . Smoking status: Never Smoker   . Smokeless tobacco: None  . Alcohol Use: None      Allergies  Allergen Reactions  . Atorvastatin Other (See Comments)     Current Outpatient Prescriptions  Medication Sig Dispense Refill  . aspirin 81 MG tablet Take 81 mg by mouth daily.    . Cholecalciferol (VITAMIN D3) 1000 UNITS CAPS Take by mouth.    . ferrous sulfate 325 (65 FE) MG tablet Take by mouth.    Marland Kitchen glimepiride (AMARYL) 4 MG tablet Take by mouth.    Marland Kitchen glucose blood (BAYER CONTOUR NEXT TEST) test strip Use once daily. Use as instructed.    Marland Kitchen letrozole (FEMARA) 2.5 MG tablet TAKE ONE TABLET BY MOUTH ONCE DAILY 30 tablet 2  . lidocaine-prilocaine (EMLA) cream Apply 1 application topically as needed. 30 min prior to accessing port 30 g 3  . metFORMIN (GLUCOPHAGE) 1000 MG tablet Take by mouth.    . Multiple Vitamin (MULTI-VITAMINS) TABS Take by mouth.    . pravastatin (PRAVACHOL) 10 MG tablet Take 10 mg by mouth daily.    . valsartan-hydrochlorothiazide (DIOVAN-HCT) 320-25 MG per tablet      No current facility-administered medications for this visit.    OBJECTIVE:  Filed Vitals:   07/10/15 1124  BP: 125/77  Pulse: 96  Temp: 96.3 F (35.7 C)     There is no height on file to calculate BMI.    ECOG FS:0 - Asymptomatic  PHYSICAL EXAM: GENERAL:  Well developed, well nourished, sitting comfortably in the exam room in no acute distress. MENTAL STATUS:  Alert and oriented to person, place and time.   RESPIRATORY:  Clear to auscultation without rales, wheezes or rhonchi. CARDIOVASCULAR:  Regular rate and rhythm without murmur, rub or gallop. BREAST:  Right breast : Chest wall area and no evidence of recurrent disease.  Left breast without masses, skin changes or nipple discharge. ABDOMEN:  Soft, non-tender, with active bowel sounds, and no hepatosplenomegaly.  No masses. BACK:  No CVA tenderness.  No tenderness on percussion of the back or rib cage. SKIN:  No rashes, ulcers or lesions. EXTREMITIES: No edema, no skin discoloration or tenderness.  No palpable cords. LYMPH NODES: No palpable cervical, supraclavicular, axillary or inguinal  adenopathy  NEUROLOGICAL: Unremarkable. PSYCH:  Appropriate.   LAB RESULTS:  CBC Latest Ref Rng 07/10/2015 05/06/2014  WBC 3.6 - 11.0 K/uL 7.2 6.9  Hemoglobin 12.0 - 16.0 g/dL 12.6 11.4(L)  Hematocrit 35.0 - 47.0 % 39.9 36.8  Platelets 150 - 440 K/uL 348 322    Appointment on 07/10/2015  Component Date Value Ref Range Status  . WBC 07/10/2015 7.2  3.6 - 11.0 K/uL Final  . RBC 07/10/2015 5.95* 3.80 - 5.20 MIL/uL Final  . Hemoglobin 07/10/2015 12.6  12.0 - 16.0 g/dL Final  . HCT 07/10/2015 39.9  35.0 - 47.0 % Final  . MCV 07/10/2015 67.2* 80.0 - 100.0 fL Final  . MCH 07/10/2015 21.1* 26.0 - 34.0 pg Final  . MCHC 07/10/2015 31.4* 32.0 - 36.0 g/dL Final  . RDW 07/10/2015 14.5  11.5 - 14.5 % Final  . Platelets 07/10/2015 348  150 - 440 K/uL Final  . Neutrophils Relative % 07/10/2015 57   Final  . Neutro Abs 07/10/2015 4.1  1.4 -  6.5 K/uL Final  . Lymphocytes Relative 07/10/2015 36   Final  . Lymphs Abs 07/10/2015 2.6  1.0 - 3.6 K/uL Final  . Monocytes Relative 07/10/2015 6   Final  . Monocytes Absolute 07/10/2015 0.4  0.2 - 0.9 K/uL Final  . Eosinophils Relative 07/10/2015 1   Final  . Eosinophils Absolute 07/10/2015 0.1  0 - 0.7 K/uL Final  . Basophils Relative 07/10/2015 0   Final  . Basophils Absolute 07/10/2015 0.0  0 - 0.1 K/uL Final  . Sodium 07/10/2015 135  135 - 145 mmol/L Final  . Potassium 07/10/2015 4.0  3.5 - 5.1 mmol/L Final  . Chloride 07/10/2015 99* 101 - 111 mmol/L Final  . CO2 07/10/2015 27  22 - 32 mmol/L Final  . Glucose, Bld 07/10/2015 237* 65 - 99 mg/dL Final  . BUN 07/10/2015 25* 6 - 20 mg/dL Final  . Creatinine, Ser 07/10/2015 1.12* 0.44 - 1.00 mg/dL Final  . Calcium 07/10/2015 9.6  8.9 - 10.3 mg/dL Final  . Total Protein 07/10/2015 7.4  6.5 - 8.1 g/dL Final  . Albumin 07/10/2015 4.2  3.5 - 5.0 g/dL Final  . AST 07/10/2015 30  15 - 41 U/L Final  . ALT 07/10/2015 39  14 - 54 U/L Final  . Alkaline Phosphatase 07/10/2015 100  38 - 126 U/L Final  . Total  Bilirubin 07/10/2015 0.8  0.3 - 1.2 mg/dL Final  . GFR calc non Af Amer 07/10/2015 50* >60 mL/min Final  . GFR calc Af Amer 07/10/2015 58* >60 mL/min Final   Comment: (NOTE) The eGFR has been calculated using the CKD EPI equation. This calculation has not been validated in all clinical situations. eGFR's persistently <60 mL/min signify possible Chronic Kidney Disease.   . Anion gap 07/10/2015 9  5 - 15 Final  . CA 27.29 07/10/2015 27.1  0.0 - 38.6 U/mL Final   Comment: (NOTE) [employer] Cent[employer]/ACS methodology Performed At: [facility] [address] [place], [place] [id number] Lindon Romp MD GE:[id number]    IMPRESSION: Persistent fatty infiltrative change of the liver as well as mild hepatomegaly. No focal mass is demonstrated.   Electronically Signed  By: David Martinique M.D.  On: 03/19/2015 13:03IMPRESSION:     ASSESSMENT: Carcinoma of right breast recurrent disease status postmastectomy on letrozole status post chemotherapy with Cytoxan and Taxotere Of Abdomen Shows Fatty Liver Patient is due for mammogram next ear will get a bone density study done   MEDICAL DECISION MAKING:   All lab data has been reviewed. Tumor markers stable Ultrasound of abdomen no evidence of recurrent disease Continue   FEMARA.  Patient expressed understanding and was in agreement with this plan. She also understands that She can call clinic at any time with any questions, concerns, or complaints.    No matching staging information was found for the patient.  Forest Gleason, MD   07/12/2015 8:35 AM

## 2015-08-05 ENCOUNTER — Inpatient Hospital Stay: Payer: Medicare Other | Attending: Oncology

## 2015-08-05 DIAGNOSIS — C801 Malignant (primary) neoplasm, unspecified: Secondary | ICD-10-CM

## 2015-08-05 DIAGNOSIS — Z170 Estrogen receptor positive status [ER+]: Secondary | ICD-10-CM | POA: Diagnosis not present

## 2015-08-05 DIAGNOSIS — Z452 Encounter for adjustment and management of vascular access device: Secondary | ICD-10-CM | POA: Diagnosis not present

## 2015-08-05 DIAGNOSIS — C50911 Malignant neoplasm of unspecified site of right female breast: Secondary | ICD-10-CM | POA: Diagnosis not present

## 2015-08-05 MED ORDER — SODIUM CHLORIDE 0.9 % IJ SOLN
10.0000 mL | Freq: Once | INTRAMUSCULAR | Status: AC
  Administered 2015-08-05: 10 mL via INTRAVENOUS
  Filled 2015-08-05: qty 10

## 2015-08-05 MED ORDER — HEPARIN SOD (PORK) LOCK FLUSH 100 UNIT/ML IV SOLN
500.0000 [IU] | Freq: Once | INTRAVENOUS | Status: AC
  Administered 2015-08-05: 500 [IU] via INTRAVENOUS
  Filled 2015-08-05: qty 5

## 2015-08-18 ENCOUNTER — Other Ambulatory Visit: Payer: Self-pay | Admitting: Oncology

## 2015-08-18 ENCOUNTER — Telehealth: Payer: Self-pay | Admitting: *Deleted

## 2015-08-18 NOTE — Telephone Encounter (Signed)
Done

## 2015-08-18 NOTE — Telephone Encounter (Signed)
-----   Message from [name] sent at 08/18/2015  1:49 PM EDT ----- Regarding: PT Refill Per pt need refill on Letrozole.

## 2015-08-28 ENCOUNTER — Encounter: Payer: Self-pay | Admitting: *Deleted

## 2015-08-29 ENCOUNTER — Ambulatory Visit
Admission: RE | Admit: 2015-08-29 | Discharge: 2015-08-29 | Disposition: A | Discharge status: Home / Self Care | Payer: Medicare Other | Source: Ambulatory Visit | Attending: Gastroenterology | Admitting: Gastroenterology

## 2015-08-29 ENCOUNTER — Encounter: Payer: Self-pay | Admitting: *Deleted

## 2015-08-29 ENCOUNTER — Ambulatory Visit: Payer: Medicare Other | Admitting: Anesthesiology

## 2015-08-29 ENCOUNTER — Encounter
Admission: RE | Disposition: A | Discharge status: Home / Self Care | Payer: Self-pay | Source: Ambulatory Visit | Attending: Gastroenterology

## 2015-08-29 DIAGNOSIS — I10 Essential (primary) hypertension: Secondary | ICD-10-CM | POA: Diagnosis not present

## 2015-08-29 DIAGNOSIS — Z7984 Long term (current) use of oral hypoglycemic drugs: Secondary | ICD-10-CM | POA: Diagnosis not present

## 2015-08-29 DIAGNOSIS — Z90710 Acquired absence of both cervix and uterus: Secondary | ICD-10-CM | POA: Diagnosis not present

## 2015-08-29 DIAGNOSIS — Z888 Allergy status to other drugs, medicaments and biological substances status: Secondary | ICD-10-CM | POA: Insufficient documentation

## 2015-08-29 DIAGNOSIS — E119 Type 2 diabetes mellitus without complications: Secondary | ICD-10-CM | POA: Diagnosis not present

## 2015-08-29 DIAGNOSIS — Z6839 Body mass index (BMI) 39.0-39.9, adult: Secondary | ICD-10-CM | POA: Diagnosis not present

## 2015-08-29 DIAGNOSIS — Z79899 Other long term (current) drug therapy: Secondary | ICD-10-CM | POA: Insufficient documentation

## 2015-08-29 DIAGNOSIS — Z79811 Long term (current) use of aromatase inhibitors: Secondary | ICD-10-CM | POA: Diagnosis not present

## 2015-08-29 DIAGNOSIS — E7800 Pure hypercholesterolemia, unspecified: Secondary | ICD-10-CM | POA: Insufficient documentation

## 2015-08-29 DIAGNOSIS — Z1211 Encounter for screening for malignant neoplasm of colon: Secondary | ICD-10-CM | POA: Diagnosis not present

## 2015-08-29 DIAGNOSIS — Z853 Personal history of malignant neoplasm of breast: Secondary | ICD-10-CM | POA: Diagnosis not present

## 2015-08-29 DIAGNOSIS — Z9011 Acquired absence of right breast and nipple: Secondary | ICD-10-CM | POA: Insufficient documentation

## 2015-08-29 DIAGNOSIS — Z7982 Long term (current) use of aspirin: Secondary | ICD-10-CM | POA: Diagnosis not present

## 2015-08-29 DIAGNOSIS — M1990 Unspecified osteoarthritis, unspecified site: Secondary | ICD-10-CM | POA: Insufficient documentation

## 2015-08-29 HISTORY — DX: Diverticulosis of intestine, part unspecified, without perforation or abscess without bleeding: K57.90

## 2015-08-29 HISTORY — DX: Malignant (primary) neoplasm, unspecified: C80.1

## 2015-08-29 HISTORY — PX: COLONOSCOPY WITH PROPOFOL: SHX5780

## 2015-08-29 HISTORY — DX: Essential (primary) hypertension: I10

## 2015-08-29 HISTORY — DX: Pure hypercholesterolemia, unspecified: E78.00

## 2015-08-29 HISTORY — DX: Tinea unguium: B35.1

## 2015-08-29 HISTORY — DX: Unspecified osteoarthritis, unspecified site: M19.90

## 2015-08-29 HISTORY — DX: Anemia, unspecified: D64.9

## 2015-08-29 HISTORY — DX: Type 2 diabetes mellitus without complications: E11.9

## 2015-08-29 LAB — GLUCOSE, CAPILLARY: Glucose-Capillary: 199 mg/dL — ABNORMAL HIGH (ref 65–99)

## 2015-08-29 SURGERY — COLONOSCOPY WITH PROPOFOL
Anesthesia: General

## 2015-08-29 MED ORDER — PROPOFOL 500 MG/50ML IV EMUL
INTRAVENOUS | Status: DC | PRN
  Administered 2015-08-29: 100 ug/kg/min via INTRAVENOUS

## 2015-08-29 MED ORDER — SODIUM CHLORIDE 0.9 % IV SOLN
INTRAVENOUS | Status: DC
  Administered 2015-08-29: 08:00:00 via INTRAVENOUS

## 2015-08-29 MED ORDER — SODIUM CHLORIDE 0.9 % IV SOLN: INTRAVENOUS | Status: DC

## 2015-08-29 MED ORDER — PROPOFOL 10 MG/ML IV BOLUS
INTRAVENOUS | Status: DC | PRN
  Administered 2015-08-29: 40 mg via INTRAVENOUS
  Administered 2015-08-29: 100 mg via INTRAVENOUS

## 2015-08-29 MED ORDER — LIDOCAINE HCL (CARDIAC) 20 MG/ML IV SOLN
INTRAVENOUS | Status: DC | PRN
  Administered 2015-08-29: 20 mg via INTRAVENOUS

## 2015-08-29 NOTE — Anesthesia Preprocedure Evaluation (Signed)
Anesthesia Evaluation  Patient identified by MRN, date of birth, ID band Patient awake    Reviewed: Allergy & Precautions, H&P , NPO status , Patient's Chart, lab work & pertinent test results, reviewed documented beta blocker date and time   History of Anesthesia Complications Negative for: history of anesthetic complications  Airway Mallampati: III  TM Distance: >3 FB Neck ROM: full    Dental no notable dental hx. (+) Teeth Intact   Pulmonary neg pulmonary ROS,    Pulmonary exam normal breath sounds clear to auscultation       Cardiovascular Exercise Tolerance: Good hypertension, On Medications (-) angina(-) CAD, (-) Past MI, (-) Cardiac Stents and (-) CABG Normal cardiovascular exam(-) dysrhythmias (-) Valvular Problems/Murmurs Rhythm:regular Rate:Normal     Neuro/Psych negative neurological ROS  negative psych ROS   GI/Hepatic negative GI ROS, Neg liver ROS,   Endo/Other  diabetes, Well Controlled, Oral Hypoglycemic AgentsMorbid obesity  Renal/GU negative Renal ROS  negative genitourinary   Musculoskeletal   Abdominal   Peds  Hematology  (+) Blood dyscrasia, anemia ,   Anesthesia Other Findings Past Medical History:   Anemia                                                       Dermatophytosis of nail                                      Diverticulosis                                               Degenerative joint disease                                   Hypertension                                                 Cancer ([place])                                                   Comment:breast cancer   Hypercholesterolemia                                         Diabetes mellitus without complication ([place])                 Reproductive/Obstetrics negative OB ROS                             Anesthesia Physical Anesthesia Plan  ASA: III  Anesthesia Plan: General   Post-op Pain  Management:    Induction:   Airway Management Planned:  Additional Equipment:   Intra-op Plan:   Post-operative Plan:   Informed Consent: I have reviewed the patients History and Physical, chart, labs and discussed the procedure including the risks, benefits and alternatives for the proposed anesthesia with the patient or authorized representative who has indicated his/her understanding and acceptance.   Dental Advisory Given  Plan Discussed with: Anesthesiologist, CRNA and Surgeon  Anesthesia Plan Comments:         Anesthesia Quick Evaluation

## 2015-08-29 NOTE — Transfer of Care (Signed)
Immediate Anesthesia Transfer of Care Note  Patient: Nancy Gaines  Procedure(s) Performed: Procedure(s): COLONOSCOPY WITH PROPOFOL (N/A)  Patient Location: Endoscopy Unit  Anesthesia Type:General  Level of Consciousness: sedated  Airway & Oxygen Therapy: Patient Spontanous Breathing and Patient connected to nasal cannula oxygen  Post-op Assessment: Report given to RN and Post -op Vital signs reviewed and stable  Post vital signs: Reviewed and stable  Last Vitals:  Filed Vitals:   08/29/15 0758  BP: 186/93  Pulse: 91  Temp: 36.1 C  Resp: 10    Complications: No apparent anesthesia complications

## 2015-08-29 NOTE — H&P (Signed)
  Date of Initial H&P: 08/01/2015  History reviewed, patient examined, no change in status, stable for surgery.

## 2015-08-29 NOTE — Op Note (Signed)
[facility] Gastroenterology Patient Name: Nancy Gaines Procedure Date: 08/29/2015 8:57 AM MRN: [id number] Account #: [id number] Date of Birth: 1946/03/15 Admit Type: Outpatient Age: 69 Room: [facility] ENDO ROOM 4 Gender: Female Note Status: Finalized Procedure:         Colonoscopy Indications:       Screening for colorectal malignant neoplasm Providers:         Lupita Dawn. Candace Cruise, MD Referring MD:      Martie Lee. Oliva Bustard, MD (Referring MD), Glendon Axe                     (Referring MD) Medicines:         Monitored Anesthesia Care Complications:     No immediate complications. Procedure:         Pre-Anesthesia Assessment:                    - Prior to the procedure, a History and Physical was                     performed, and patient medications, allergies and                     sensitivities were reviewed. The patient's tolerance of                     previous anesthesia was reviewed.                    - The risks and benefits of the procedure and the sedation                     options and risks were discussed with the patient. All                     questions were answered and informed consent was obtained.                    - After reviewing the risks and benefits, the patient was                     deemed in satisfactory condition to undergo the procedure.                    After obtaining informed consent, the colonoscope was                     passed under direct vision. Throughout the procedure, the                     patient's blood pressure, pulse, and oxygen saturations                     were monitored continuously. The Colonoscope was                     introduced through the anus and advanced to the the cecum,                     identified by appendiceal orifice and ileocecal valve. The                     colonoscopy was performed without difficulty. The patient  tolerated the procedure well. The quality of the bowel             preparation was good. Findings:      The colon (entire examined portion) appeared normal. Impression:        - The entire examined colon is normal.                    - No specimens collected. Recommendation:    - Discharge patient to home.                    - Repeat colonoscopy in 10 years for surveillance.                    - The findings and recommendations were discussed with the                     patient. Procedure Code(s): --- Professional ---                    [id number], Colonoscopy, flexible; diagnostic, including                     collection of specimen(s) by brushing or washing, when                     performed (separate procedure) Diagnosis Code(s): --- Professional ---                    Z12.11, Encounter for screening for malignant neoplasm of                     colon CPT copyright 2014 American Medical Association. All rights reserved. The codes documented in this report are preliminary and upon coder review may  be revised to meet current compliance requirements. Hulen Luster, MD 08/29/2015 9:18:00 AM This report has been signed electronically. Number of Addenda: 0 Note Initiated On: 08/29/2015 8:57 AM Scope Withdrawal Time: 0 hours 5 minutes 9 seconds  Total Procedure Duration: 0 hours 9 minutes 12 seconds       [facility]

## 2015-08-31 ENCOUNTER — Encounter: Payer: Self-pay | Admitting: Gastroenterology

## 2015-09-01 NOTE — Anesthesia Postprocedure Evaluation (Signed)
  Anesthesia Post-op Note  Patient: Nancy Gaines  Procedure(s) Performed: Procedure(s): COLONOSCOPY WITH PROPOFOL (N/A)  Anesthesia type:General  Patient location: PACU  Post pain: Pain level controlled  Post assessment: Post-op Vital signs reviewed, Patient's Cardiovascular Status Stable, Respiratory Function Stable, Patent Airway and No signs of Nausea or vomiting  Post vital signs: Reviewed and stable  Last Vitals:  Filed Vitals:   08/29/15 0946  BP: 127/99  Pulse: 70  Temp:   Resp: 18    Level of consciousness: awake, alert  and patient cooperative  Complications: No apparent anesthesia complications

## 2015-09-15 ENCOUNTER — Other Ambulatory Visit: Payer: Self-pay | Admitting: Oncology

## 2015-09-16 ENCOUNTER — Inpatient Hospital Stay: Payer: Medicare Other | Attending: Oncology

## 2015-09-16 DIAGNOSIS — Z452 Encounter for adjustment and management of vascular access device: Secondary | ICD-10-CM | POA: Diagnosis not present

## 2015-09-16 DIAGNOSIS — Z170 Estrogen receptor positive status [ER+]: Secondary | ICD-10-CM | POA: Diagnosis not present

## 2015-09-16 DIAGNOSIS — C50911 Malignant neoplasm of unspecified site of right female breast: Secondary | ICD-10-CM | POA: Diagnosis not present

## 2015-09-16 DIAGNOSIS — C801 Malignant (primary) neoplasm, unspecified: Secondary | ICD-10-CM

## 2015-09-16 MED ORDER — HEPARIN SOD (PORK) LOCK FLUSH 100 UNIT/ML IV SOLN
500.0000 [IU] | Freq: Once | INTRAVENOUS | Status: AC
  Administered 2015-09-16: 500 [IU] via INTRAVENOUS
  Filled 2015-09-16: qty 5

## 2015-09-16 MED ORDER — SODIUM CHLORIDE 0.9 % IJ SOLN
10.0000 mL | Freq: Once | INTRAMUSCULAR | Status: AC
  Administered 2015-09-16: 10 mL via INTRAVENOUS
  Filled 2015-09-16: qty 10

## 2015-09-22 ENCOUNTER — Other Ambulatory Visit: Payer: Self-pay | Admitting: Internal Medicine

## 2015-09-22 ENCOUNTER — Ambulatory Visit: Payer: Medicare Other

## 2015-09-22 ENCOUNTER — Other Ambulatory Visit: Payer: Medicare Other

## 2015-09-22 DIAGNOSIS — R1031 Right lower quadrant pain: Secondary | ICD-10-CM

## 2015-09-24 ENCOUNTER — Ambulatory Visit
Admission: RE | Admit: 2015-09-24 | Discharge: 2015-09-24 | Disposition: A | Discharge status: Home / Self Care | Payer: Medicare Other | Source: Ambulatory Visit | Attending: Oncology | Admitting: Oncology

## 2015-09-24 ENCOUNTER — Other Ambulatory Visit: Payer: Self-pay | Admitting: Oncology

## 2015-09-24 DIAGNOSIS — Z1231 Encounter for screening mammogram for malignant neoplasm of breast: Secondary | ICD-10-CM

## 2015-09-24 DIAGNOSIS — C50919 Malignant neoplasm of unspecified site of unspecified female breast: Secondary | ICD-10-CM | POA: Diagnosis not present

## 2015-09-24 DIAGNOSIS — M8580 Other specified disorders of bone density and structure, unspecified site: Secondary | ICD-10-CM

## 2015-09-24 DIAGNOSIS — M899 Disorder of bone, unspecified: Secondary | ICD-10-CM | POA: Insufficient documentation

## 2015-09-24 DIAGNOSIS — Z780 Asymptomatic menopausal state: Secondary | ICD-10-CM | POA: Diagnosis not present

## 2015-09-24 HISTORY — DX: Malignant neoplasm of unspecified site of unspecified female breast: C50.919

## 2015-10-01 ENCOUNTER — Ambulatory Visit
Admission: RE | Admit: 2015-10-01 | Discharge: 2015-10-01 | Disposition: A | Discharge status: Home / Self Care | Payer: Medicare Other | Source: Ambulatory Visit | Attending: Internal Medicine | Admitting: Internal Medicine

## 2015-10-01 DIAGNOSIS — Z90710 Acquired absence of both cervix and uterus: Secondary | ICD-10-CM | POA: Diagnosis not present

## 2015-10-01 DIAGNOSIS — R1031 Right lower quadrant pain: Secondary | ICD-10-CM | POA: Diagnosis not present

## 2015-10-20 ENCOUNTER — Other Ambulatory Visit: Payer: Self-pay | Admitting: Oncology

## 2015-10-28 ENCOUNTER — Inpatient Hospital Stay: Payer: Medicare Other | Attending: Oncology

## 2015-10-28 DIAGNOSIS — Z452 Encounter for adjustment and management of vascular access device: Secondary | ICD-10-CM | POA: Diagnosis not present

## 2015-10-28 DIAGNOSIS — Z170 Estrogen receptor positive status [ER+]: Secondary | ICD-10-CM | POA: Insufficient documentation

## 2015-10-28 DIAGNOSIS — C801 Malignant (primary) neoplasm, unspecified: Secondary | ICD-10-CM

## 2015-10-28 DIAGNOSIS — C50911 Malignant neoplasm of unspecified site of right female breast: Secondary | ICD-10-CM | POA: Diagnosis not present

## 2015-10-28 MED ORDER — HEPARIN SOD (PORK) LOCK FLUSH 100 UNIT/ML IV SOLN
500.0000 [IU] | Freq: Once | INTRAVENOUS | Status: AC
Start: 2015-10-28 — End: 2015-10-28
  Administered 2015-10-28: 500 [IU] via INTRAVENOUS
  Filled 2015-10-28: qty 5

## 2015-10-28 MED ORDER — SODIUM CHLORIDE 0.9 % IJ SOLN
10.0000 mL | INTRAMUSCULAR | Status: DC | PRN
  Administered 2015-10-28: 10 mL via INTRAVENOUS
  Filled 2015-10-28: qty 10

## 2015-11-20 ENCOUNTER — Other Ambulatory Visit: Payer: Self-pay | Admitting: Oncology

## 2015-11-21 ENCOUNTER — Other Ambulatory Visit: Payer: Self-pay | Admitting: Oncology

## 2015-12-11 ENCOUNTER — Ambulatory Visit
Admission: RE | Admit: 2015-12-11 | Discharge: 2015-12-11 | Disposition: A | Discharge status: Home / Self Care | Payer: Medicare Other | Source: Ambulatory Visit | Attending: Physician Assistant | Admitting: Physician Assistant

## 2015-12-11 ENCOUNTER — Other Ambulatory Visit: Payer: Self-pay | Admitting: Physician Assistant

## 2015-12-11 DIAGNOSIS — R51 Headache: Secondary | ICD-10-CM | POA: Diagnosis present

## 2015-12-11 DIAGNOSIS — R519 Headache, unspecified: Secondary | ICD-10-CM

## 2015-12-22 ENCOUNTER — Other Ambulatory Visit: Payer: Self-pay | Admitting: *Deleted

## 2015-12-22 MED ORDER — LETROZOLE 2.5 MG PO TABS: 2.5000 mg | ORAL_TABLET | Freq: Every day | ORAL | Status: DC

## 2016-01-07 ENCOUNTER — Other Ambulatory Visit: Payer: Medicare Other

## 2016-01-07 ENCOUNTER — Ambulatory Visit: Payer: Medicare Other | Admitting: Oncology

## 2016-01-14 ENCOUNTER — Encounter: Payer: Self-pay | Admitting: Oncology

## 2016-01-14 ENCOUNTER — Inpatient Hospital Stay: Payer: Medicare Other | Attending: Oncology

## 2016-01-14 ENCOUNTER — Inpatient Hospital Stay: Payer: Medicare Other

## 2016-01-14 ENCOUNTER — Inpatient Hospital Stay (HOSPITAL_BASED_OUTPATIENT_CLINIC_OR_DEPARTMENT_OTHER): Payer: Medicare Other | Admitting: Oncology

## 2016-01-14 VITALS — BP 125/85 | HR 97 | Temp 95.9°F | Resp 18 | Wt 209.5 lb

## 2016-01-14 DIAGNOSIS — Z7984 Long term (current) use of oral hypoglycemic drugs: Secondary | ICD-10-CM | POA: Insufficient documentation

## 2016-01-14 DIAGNOSIS — Z9011 Acquired absence of right breast and nipple: Secondary | ICD-10-CM | POA: Insufficient documentation

## 2016-01-14 DIAGNOSIS — C50911 Malignant neoplasm of unspecified site of right female breast: Secondary | ICD-10-CM | POA: Diagnosis present

## 2016-01-14 DIAGNOSIS — Z7982 Long term (current) use of aspirin: Secondary | ICD-10-CM | POA: Diagnosis not present

## 2016-01-14 DIAGNOSIS — Z170 Estrogen receptor positive status [ER+]: Secondary | ICD-10-CM

## 2016-01-14 DIAGNOSIS — Z9221 Personal history of antineoplastic chemotherapy: Secondary | ICD-10-CM | POA: Diagnosis not present

## 2016-01-14 DIAGNOSIS — E119 Type 2 diabetes mellitus without complications: Secondary | ICD-10-CM | POA: Insufficient documentation

## 2016-01-14 DIAGNOSIS — K760 Fatty (change of) liver, not elsewhere classified: Secondary | ICD-10-CM | POA: Diagnosis not present

## 2016-01-14 DIAGNOSIS — Z79811 Long term (current) use of aromatase inhibitors: Secondary | ICD-10-CM

## 2016-01-14 DIAGNOSIS — Z1231 Encounter for screening mammogram for malignant neoplasm of breast: Secondary | ICD-10-CM

## 2016-01-14 DIAGNOSIS — Z923 Personal history of irradiation: Secondary | ICD-10-CM | POA: Insufficient documentation

## 2016-01-14 DIAGNOSIS — E7800 Pure hypercholesterolemia, unspecified: Secondary | ICD-10-CM | POA: Diagnosis not present

## 2016-01-14 DIAGNOSIS — Z79899 Other long term (current) drug therapy: Secondary | ICD-10-CM | POA: Diagnosis not present

## 2016-01-14 DIAGNOSIS — I10 Essential (primary) hypertension: Secondary | ICD-10-CM | POA: Insufficient documentation

## 2016-01-14 DIAGNOSIS — Z452 Encounter for adjustment and management of vascular access device: Secondary | ICD-10-CM | POA: Diagnosis not present

## 2016-01-14 DIAGNOSIS — C50919 Malignant neoplasm of unspecified site of unspecified female breast: Secondary | ICD-10-CM

## 2016-01-14 DIAGNOSIS — Z95828 Presence of other vascular implants and grafts: Secondary | ICD-10-CM

## 2016-01-14 LAB — COMPREHENSIVE METABOLIC PANEL
ALK PHOS: 75 U/L (ref 38–126)
ALT: 31 U/L (ref 14–54)
AST: 22 U/L (ref 15–41)
Albumin: 4.1 g/dL (ref 3.5–5.0)
Anion gap: 10 (ref 5–15)
BILIRUBIN TOTAL: 1 mg/dL (ref 0.3–1.2)
BUN: 25 mg/dL — AB (ref 6–20)
CHLORIDE: 99 mmol/L — AB (ref 101–111)
CO2: 25 mmol/L (ref 22–32)
CREATININE: 1.09 mg/dL — AB (ref 0.44–1.00)
Calcium: 9.5 mg/dL (ref 8.9–10.3)
GFR, EST AFRICAN AMERICAN: 59 mL/min — AB (ref >60)
GFR, EST NON AFRICAN AMERICAN: 51 mL/min — AB (ref >60)
Glucose, Bld: 216 mg/dL — ABNORMAL HIGH (ref 65–99)
POTASSIUM: 3.8 mmol/L (ref 3.5–5.1)
Sodium: 134 mmol/L — ABNORMAL LOW (ref 135–145)
TOTAL PROTEIN: 7.5 g/dL (ref 6.5–8.1)

## 2016-01-14 LAB — CBC WITH DIFFERENTIAL/PLATELET
Basophils Absolute: 0 10*3/uL (ref 0–0.1)
Basophils Relative: 1 %
Eosinophils Absolute: 0.1 10*3/uL (ref 0–0.7)
Eosinophils Relative: 2 %
HEMATOCRIT: 36.2 % (ref 35.0–47.0)
HEMOGLOBIN: 11.5 g/dL — AB (ref 12.0–16.0)
LYMPHS ABS: 2.6 10*3/uL (ref 1.0–3.6)
LYMPHS PCT: 37 %
MCH: 21.3 pg — AB (ref 26.0–34.0)
MCHC: 31.8 g/dL — ABNORMAL LOW (ref 32.0–36.0)
MCV: 67.1 fL — AB (ref 80.0–100.0)
MONOS PCT: 6 %
Monocytes Absolute: 0.4 10*3/uL (ref 0.2–0.9)
NEUTROS PCT: 54 %
Neutro Abs: 4 10*3/uL (ref 1.4–6.5)
Platelets: 380 10*3/uL (ref 150–440)
RBC: 5.38 MIL/uL — AB (ref 3.80–5.20)
RDW: 14.9 % — ABNORMAL HIGH (ref 11.5–14.5)
WBC: 7.2 10*3/uL (ref 3.6–11.0)

## 2016-01-14 MED ORDER — SODIUM CHLORIDE 0.9% FLUSH
10.0000 mL | INTRAVENOUS | Status: DC | PRN
  Administered 2016-01-14: 10 mL via INTRAVENOUS
  Filled 2016-01-14: qty 10

## 2016-01-14 MED ORDER — HEPARIN SOD (PORK) LOCK FLUSH 100 UNIT/ML IV SOLN
500.0000 [IU] | Freq: Once | INTRAVENOUS | Status: AC
  Administered 2016-01-14: 500 [IU] via INTRAVENOUS

## 2016-01-14 NOTE — Progress Notes (Signed)
Nancy Gaines @ [facility] Telephone:(336) [phone]  Fax:([phone]) [place]: [birth year]  MR#: [id number]  BHA#:[id number]  Patient Care Team: Glendon Axe, MD as PCP - General (Internal Medicine)  CHIEF COMPLAINT:  Chief Complaint  Patient presents with  . Breast Cancer   Chief Complaint/Diagnosis:   1. December of 2013, [name], [name], PR less than 5%. HER-2 receptor not over expressed. 2. History of carcinoma of breast (right breast), 1 cm lobular carcinoma of breast with 7 negative lymph node. 3. Status post lumpectomy in August of 1996 and axillary node dissection [name] patient did not get tamoxifen as an adjuvant treatment 3. BRCA negative (verbal information from [place]) 4. Patient had a right breast mastectomy,  pT1b  p N0 M0. ER positive, PR positive.  HER-2/neu not over expressed.  5. Status post mastectomy in January of 2014.  Sentinel lymph node was not identified.  (Patient had previous lymph node dissection from prior carcinoma of breast) [name], 2014) 7. Patient was started on Cytoxan and Taxotere January 11, 2013 8.Patient is on letrozole from April of 2014   INTERVAL HISTORY: *70 year old lady with a history of carcinoma of breast.  (Right breast) came today further follow-up.  Patient had a mastectomy.  Taking letrozole or tolerating treatment very well.  No bony pain.  Due for next mammogram as well as bone density study.  No bony pain no bony fracture. Getting regular mammograms done  REVIEW OF SYSTEMS:   GENERAL:  Feels good.  Active.  No fevers, sweats or weight loss. PERFORMANCE STATUS (ECOG): 0 HEENT:  No visual changes, runny nose, sore throat, mouth sores or tenderness. Lungs: No  shortness of breath or cough.  No hemoptysis. Cardiac:  No chest pain, palpitations, orthopnea, or PND. GI:  No nausea, vomiting, diarrhea, constipation, melena or hematochezia. GU:  No urgency, frequency, dysuria, or hematuria. Musculoskeletal:  No back pain.  No joint pain.  No muscle tenderness. Extremities:  No pain or swelling. Skin:  No rashes or skin changes. Neuro:  No headache, numbness or weakness, balance or coordination issues. Endocrine:  No diabetes, thyroid issues, hot flashes or night sweats. Psych:  No mood changes, depression or anxiety. Pain:  No focal pain. Review of systems:  All other systems reviewed and found to be negative. As per HPI. Otherwise, a complete review of systems is negatve.   No Known Allergies:   Significant History/PMH:   HTN:    Diabetes Mellitus, Type II (NIDD):    Anemia:    Breast Cancer:    Dilation and Curretage:    Hysterectomy: 2008   Right Partial Mastectomy:    Right Mastectomy with SN biopsy: Jan 2014  Preventive Screening:  Has patient had any of the following test? Colonscopy  Mammography  Pap Smear (1)   Last Colonoscopy: unknown date, [facility](1)   Last Mammography: 09/2014   Last Pap Smear: Dec 2013(1)   PFSH: Comments: There is strong family history of breast cancer.  Sister had bilateral inflammatory carcinoma of breast there is family history of stomach cancer  Social History: negative alcohol, negative tobacco  Additional Past Medical and Surgical History: History of diabetes type II   Hypertension   Hypercholesterolemia   Inflammatory  arthritis   ADVANCED DIRECTIVES:  Patient does not have any living will or healthcare power of attorney.  Information was given .  Available resources had been discussed.  We will follow-up on subsequent appointments regarding this issu HEALTH MAINTENANCE: Social History  Substance Use Topics  . Smoking status: Never Smoker   . Smokeless tobacco: Never Used  . Alcohol  Use: No      Allergies  Allergen Reactions  . Lipitor [Atorvastatin] Other (See Comments)    Current Outpatient Prescriptions  Medication Sig Dispense Refill  . aspirin 81 MG tablet Take 81 mg by mouth daily.    . Cholecalciferol (VITAMIN D3) 1000 UNITS CAPS Take by mouth.    . ciclopirox (PENLAC) 8 % solution Apply topically at bedtime. Apply over nail and surrounding skin. Apply daily over previous coat. After seven (7) days, may remove with alcohol and continue cycle.    . ferrous sulfate 325 (65 FE) MG tablet Take by mouth.    Marland Kitchen glimepiride (AMARYL) 4 MG tablet Take by mouth.    Marland Kitchen glucose blood (BAYER CONTOUR NEXT TEST) test strip Use once daily. Use as instructed.    Marland Kitchen letrozole (FEMARA) 2.5 MG tablet Take 1 tablet (2.5 mg total) by mouth daily. 30 tablet 0  . lidocaine-prilocaine (EMLA) cream Apply 1 application topically as needed. 30 min prior to accessing port 30 g 3  . Multiple Vitamin (MULTI-VITAMINS) TABS Take by mouth.    . pravastatin (PRAVACHOL) 10 MG tablet Take 10 mg by mouth daily.    . tizanidine (ZANAFLEX) 2 MG capsule Take 2 mg by mouth daily as needed for muscle spasms (nightly).    . valsartan-hydrochlorothiazide (DIOVAN-HCT) 320-25 MG per tablet     . metFORMIN (GLUCOPHAGE) 1000 MG tablet Take by mouth.     No current facility-administered medications for this visit.    OBJECTIVE:  Filed Vitals:   01/14/16 0954  BP: 125/85  Pulse: 97  Temp: 95.9 F (35.5 C)  Resp: 18     Body mass index is 38.31 kg/(m^2).    ECOG FS:0 - Asymptomatic  PHYSICAL EXAM: GENERAL:  Well developed, well nourished, sitting comfortably in the exam room in no acute distress. MENTAL STATUS:  Alert and oriented to person, place and time.   RESPIRATORY:  Clear to auscultation without rales, wheezes or rhonchi. CARDIOVASCULAR:  Regular rate and rhythm without murmur, rub or gallop. BREAST:  Right breast : Chest wall area and no evidence of recurrent disease.  Left breast without  masses, skin changes or nipple discharge. ABDOMEN:  Soft, non-tender, with active bowel sounds, and no hepatosplenomegaly.  No masses. BACK:  No CVA tenderness.  No tenderness on percussion of the back or rib cage. SKIN:  No rashes, ulcers or lesions. EXTREMITIES: No edema, no skin discoloration or tenderness.  No palpable cords. LYMPH NODES: No palpable cervical, supraclavicular, axillary or inguinal adenopathy  NEUROLOGICAL: Unremarkable. PSYCH:  Appropriate.   LAB RESULTS:  CBC Latest Ref Rng 01/14/2016 07/10/2015  WBC 3.6 - 11.0 K/uL 7.2 7.2  Hemoglobin 12.0 - 16.0 g/dL 11.5(L) 12.6  Hematocrit 35.0 - 47.0 % 36.2 39.9  Platelets 150 - 440 K/uL 380 348    Appointment on 01/14/2016  Component Date Value Ref Range Status  . WBC 01/14/2016 7.2  3.6 - 11.0 K/uL Final  . RBC 01/14/2016 5.38* 3.80 - 5.20 MIL/uL Final  . Hemoglobin 01/14/2016 11.5* 12.0 - 16.0 g/dL Final  . HCT 01/14/2016 36.2  35.0 - 47.0 %  Final  . MCV 01/14/2016 67.1* 80.0 - 100.0 fL Final  . MCH 01/14/2016 21.3* 26.0 - 34.0 pg Final  . MCHC 01/14/2016 31.8* 32.0 - 36.0 g/dL Final  . RDW 01/14/2016 14.9* 11.5 - 14.5 % Final  . Platelets 01/14/2016 380  150 - 440 K/uL Final  . Neutrophils Relative % 01/14/2016 54   Final  . Neutro Abs 01/14/2016 4.0  1.4 - 6.5 K/uL Final  . Lymphocytes Relative 01/14/2016 37   Final  . Lymphs Abs 01/14/2016 2.6  1.0 - 3.6 K/uL Final  . Monocytes Relative 01/14/2016 6   Final  . Monocytes Absolute 01/14/2016 0.4  0.2 - 0.9 K/uL Final  . Eosinophils Relative 01/14/2016 2   Final  . Eosinophils Absolute 01/14/2016 0.1  0 - 0.7 K/uL Final  . Basophils Relative 01/14/2016 1   Final  . Basophils Absolute 01/14/2016 0.0  0 - 0.1 K/uL Final  . Sodium 01/14/2016 134* 135 - 145 mmol/L Final  . Potassium 01/14/2016 3.8  3.5 - 5.1 mmol/L Final  . Chloride 01/14/2016 99* 101 - 111 mmol/L Final  . CO2 01/14/2016 25  22 - 32 mmol/L Final  . Glucose, Bld 01/14/2016 216* 65 - 99 mg/dL Final  .  BUN 01/14/2016 25* 6 - 20 mg/dL Final  . Creatinine, Ser 01/14/2016 1.09* 0.44 - 1.00 mg/dL Final  . Calcium 01/14/2016 9.5  8.9 - 10.3 mg/dL Final  . Total Protein 01/14/2016 7.5  6.5 - 8.1 g/dL Final  . Albumin 01/14/2016 4.1  3.5 - 5.0 g/dL Final  . AST 01/14/2016 22  15 - 41 U/L Final  . ALT 01/14/2016 31  14 - 54 U/L Final  . Alkaline Phosphatase 01/14/2016 75  38 - 126 U/L Final  . Total Bilirubin 01/14/2016 1.0  0.3 - 1.2 mg/dL Final  . GFR calc non Af Amer 01/14/2016 51* >60 mL/min Final  . GFR calc Af Amer 01/14/2016 59* >60 mL/min Final   Comment: (NOTE) The eGFR has been calculated using the CKD EPI equation. This calculation has not been validated in all clinical situations. eGFR's persistently <60 mL/min signify possible Chronic Kidney Disease.   Georgiann Hahn gap 01/14/2016 10  5 - 15 Final  Mammogram in November of 2016 shows bilateral 1     ASSESSMENT: Carcinoma of right breast recurrent disease status postmastectomy on letrozole status post chemotherapy with Cytoxan and Taxotere Of Abdomen Shows Fatty Liver  On density study in November of 2016 is within normal limit no evidence of osteopenia  MEDICAL DECISION MAKING:   All lab data has been reviewed. Tumor markers stable Ultrasound of abdomen no evidence of recurrent disease.  LIVER enzymes are within normal limit  Continue   FEMARA.  Patient expressed understanding and was in agreement with this plan. She also understands that She can call clinic at any time with any questions, concerns, or complaints.    No matching staging information was found for the patient.  Forest Gleason, MD   01/14/2016 10:05 AM

## 2016-01-16 ENCOUNTER — Encounter: Payer: Self-pay | Admitting: Oncology

## 2016-02-24 ENCOUNTER — Other Ambulatory Visit: Payer: Self-pay | Admitting: Oncology

## 2016-02-24 ENCOUNTER — Inpatient Hospital Stay: Payer: Medicare Other | Attending: Oncology

## 2016-02-24 DIAGNOSIS — Z9221 Personal history of antineoplastic chemotherapy: Secondary | ICD-10-CM | POA: Diagnosis not present

## 2016-02-24 DIAGNOSIS — C50911 Malignant neoplasm of unspecified site of right female breast: Secondary | ICD-10-CM | POA: Insufficient documentation

## 2016-02-24 DIAGNOSIS — Z9011 Acquired absence of right breast and nipple: Secondary | ICD-10-CM | POA: Diagnosis not present

## 2016-02-24 DIAGNOSIS — Z452 Encounter for adjustment and management of vascular access device: Secondary | ICD-10-CM | POA: Diagnosis not present

## 2016-02-24 DIAGNOSIS — C50919 Malignant neoplasm of unspecified site of unspecified female breast: Secondary | ICD-10-CM

## 2016-02-24 DIAGNOSIS — Z923 Personal history of irradiation: Secondary | ICD-10-CM | POA: Insufficient documentation

## 2016-02-24 DIAGNOSIS — Z170 Estrogen receptor positive status [ER+]: Secondary | ICD-10-CM | POA: Insufficient documentation

## 2016-02-24 MED ORDER — SODIUM CHLORIDE 0.9% FLUSH
10.0000 mL | Freq: Once | INTRAVENOUS | Status: AC
  Administered 2016-02-24: 10 mL via INTRAVENOUS
  Filled 2016-02-24: qty 10

## 2016-02-24 MED ORDER — HEPARIN SOD (PORK) LOCK FLUSH 100 UNIT/ML IV SOLN
INTRAVENOUS | Status: AC
  Filled 2016-02-24: qty 5

## 2016-02-24 MED ORDER — HEPARIN SOD (PORK) LOCK FLUSH 100 UNIT/ML IV SOLN
500.0000 [IU] | Freq: Once | INTRAVENOUS | Status: AC
  Administered 2016-02-24: 500 [IU] via INTRAVENOUS

## 2016-02-25 ENCOUNTER — Inpatient Hospital Stay: Payer: Medicare Other

## 2016-03-23 ENCOUNTER — Other Ambulatory Visit: Payer: Self-pay | Admitting: Oncology

## 2016-04-07 ENCOUNTER — Inpatient Hospital Stay: Payer: Medicare Other | Attending: Oncology

## 2016-04-07 DIAGNOSIS — Z95828 Presence of other vascular implants and grafts: Secondary | ICD-10-CM

## 2016-04-07 DIAGNOSIS — Z452 Encounter for adjustment and management of vascular access device: Secondary | ICD-10-CM | POA: Insufficient documentation

## 2016-04-07 DIAGNOSIS — Z170 Estrogen receptor positive status [ER+]: Secondary | ICD-10-CM | POA: Insufficient documentation

## 2016-04-07 DIAGNOSIS — C50911 Malignant neoplasm of unspecified site of right female breast: Secondary | ICD-10-CM | POA: Insufficient documentation

## 2016-04-07 DIAGNOSIS — Z923 Personal history of irradiation: Secondary | ICD-10-CM | POA: Diagnosis not present

## 2016-04-07 DIAGNOSIS — Z9011 Acquired absence of right breast and nipple: Secondary | ICD-10-CM | POA: Insufficient documentation

## 2016-04-07 DIAGNOSIS — Z9221 Personal history of antineoplastic chemotherapy: Secondary | ICD-10-CM | POA: Insufficient documentation

## 2016-04-07 MED ORDER — HEPARIN SOD (PORK) LOCK FLUSH 100 UNIT/ML IV SOLN
INTRAVENOUS | Status: AC
  Filled 2016-04-07: qty 5

## 2016-04-07 MED ORDER — HEPARIN SOD (PORK) LOCK FLUSH 100 UNIT/ML IV SOLN
500.0000 [IU] | Freq: Once | INTRAVENOUS | Status: AC
  Administered 2016-04-07: 500 [IU] via INTRAVENOUS

## 2016-04-07 MED ORDER — SODIUM CHLORIDE 0.9% FLUSH
10.0000 mL | Freq: Once | INTRAVENOUS | Status: DC
  Filled 2016-04-07: qty 10

## 2016-04-07 MED ORDER — SODIUM CHLORIDE 0.9% FLUSH
10.0000 mL | Freq: Once | INTRAVENOUS | Status: AC
  Administered 2016-04-07: 10 mL via INTRAVENOUS
  Filled 2016-04-07: qty 10

## 2016-04-26 ENCOUNTER — Telehealth: Payer: Self-pay | Admitting: *Deleted

## 2016-04-26 MED ORDER — LETROZOLE 2.5 MG PO TABS: 2.5000 mg | ORAL_TABLET | Freq: Every day | ORAL | Status: DC

## 2016-04-26 NOTE — Telephone Encounter (Signed)
-----   Message from Hazeline Junker sent at 04/26/2016 11:47 AM EDT ----- Contact: [phone] Needs refill on Letrozole called into [facility] on [facility]. Please call patient and let her know when script has been called in. Thanks!

## 2016-05-19 ENCOUNTER — Inpatient Hospital Stay: Payer: Medicare Other | Attending: Internal Medicine

## 2016-05-19 DIAGNOSIS — Z452 Encounter for adjustment and management of vascular access device: Secondary | ICD-10-CM | POA: Diagnosis not present

## 2016-05-19 DIAGNOSIS — Z95828 Presence of other vascular implants and grafts: Secondary | ICD-10-CM

## 2016-05-19 DIAGNOSIS — C50911 Malignant neoplasm of unspecified site of right female breast: Secondary | ICD-10-CM | POA: Insufficient documentation

## 2016-05-19 DIAGNOSIS — Z170 Estrogen receptor positive status [ER+]: Secondary | ICD-10-CM | POA: Diagnosis not present

## 2016-05-19 DIAGNOSIS — Z79811 Long term (current) use of aromatase inhibitors: Secondary | ICD-10-CM | POA: Insufficient documentation

## 2016-05-19 MED ORDER — SODIUM CHLORIDE 0.9% FLUSH
10.0000 mL | INTRAVENOUS | Status: DC | PRN
  Administered 2016-05-19: 10 mL via INTRAVENOUS
  Filled 2016-05-19: qty 10

## 2016-05-19 MED ORDER — HEPARIN SOD (PORK) LOCK FLUSH 100 UNIT/ML IV SOLN
500.0000 [IU] | Freq: Once | INTRAVENOUS | Status: AC
  Administered 2016-05-19: 500 [IU] via INTRAVENOUS

## 2016-06-30 ENCOUNTER — Telehealth: Payer: Self-pay | Admitting: *Deleted

## 2016-06-30 NOTE — Telephone Encounter (Signed)
Call returned to patient and she was advised that she needs to call her PCP for these refills. She stated she was sorry, she knew that and will call Dr Candiss Norse.

## 2016-06-30 NOTE — Telephone Encounter (Signed)
-----   Message from [name] sent at 06/30/2016  1:41 PM EDT ----- Contact: [phone] Pt need refill Glimepiride 4mg  and Pravastatin 20 mg .the patient is complete out.

## 2016-07-13 ENCOUNTER — Other Ambulatory Visit: Payer: Self-pay

## 2016-07-13 DIAGNOSIS — C50919 Malignant neoplasm of unspecified site of unspecified female breast: Secondary | ICD-10-CM

## 2016-07-15 ENCOUNTER — Encounter: Payer: Self-pay | Admitting: Internal Medicine

## 2016-07-15 ENCOUNTER — Inpatient Hospital Stay: Payer: Medicare Other

## 2016-07-15 ENCOUNTER — Inpatient Hospital Stay (HOSPITAL_BASED_OUTPATIENT_CLINIC_OR_DEPARTMENT_OTHER): Payer: Medicare Other | Admitting: Internal Medicine

## 2016-07-15 ENCOUNTER — Inpatient Hospital Stay: Payer: Medicare Other | Attending: Internal Medicine

## 2016-07-15 VITALS — BP 139/84 | HR 87 | Temp 95.7°F | Resp 17 | Ht 62.0 in | Wt 213.0 lb

## 2016-07-15 DIAGNOSIS — Z170 Estrogen receptor positive status [ER+]: Secondary | ICD-10-CM | POA: Insufficient documentation

## 2016-07-15 DIAGNOSIS — C50911 Malignant neoplasm of unspecified site of right female breast: Secondary | ICD-10-CM | POA: Insufficient documentation

## 2016-07-15 DIAGNOSIS — Z923 Personal history of irradiation: Secondary | ICD-10-CM | POA: Insufficient documentation

## 2016-07-15 DIAGNOSIS — E119 Type 2 diabetes mellitus without complications: Secondary | ICD-10-CM | POA: Diagnosis not present

## 2016-07-15 DIAGNOSIS — D509 Iron deficiency anemia, unspecified: Secondary | ICD-10-CM

## 2016-07-15 DIAGNOSIS — Z853 Personal history of malignant neoplasm of breast: Secondary | ICD-10-CM | POA: Diagnosis not present

## 2016-07-15 DIAGNOSIS — I10 Essential (primary) hypertension: Secondary | ICD-10-CM | POA: Insufficient documentation

## 2016-07-15 DIAGNOSIS — Z9221 Personal history of antineoplastic chemotherapy: Secondary | ICD-10-CM | POA: Insufficient documentation

## 2016-07-15 DIAGNOSIS — E7800 Pure hypercholesterolemia, unspecified: Secondary | ICD-10-CM | POA: Insufficient documentation

## 2016-07-15 DIAGNOSIS — C50811 Malignant neoplasm of overlapping sites of right female breast: Secondary | ICD-10-CM | POA: Insufficient documentation

## 2016-07-15 DIAGNOSIS — Z9011 Acquired absence of right breast and nipple: Secondary | ICD-10-CM | POA: Insufficient documentation

## 2016-07-15 DIAGNOSIS — C50812 Malignant neoplasm of overlapping sites of left female breast: Secondary | ICD-10-CM | POA: Insufficient documentation

## 2016-07-15 DIAGNOSIS — Z7982 Long term (current) use of aspirin: Secondary | ICD-10-CM | POA: Diagnosis not present

## 2016-07-15 DIAGNOSIS — Z79811 Long term (current) use of aromatase inhibitors: Secondary | ICD-10-CM | POA: Insufficient documentation

## 2016-07-15 DIAGNOSIS — Z7984 Long term (current) use of oral hypoglycemic drugs: Secondary | ICD-10-CM | POA: Insufficient documentation

## 2016-07-15 LAB — CBC WITH DIFFERENTIAL/PLATELET
BASOS ABS: 0 10*3/uL (ref 0–0.1)
Basophils Relative: 0 %
Eosinophils Absolute: 0.2 10*3/uL (ref 0–0.7)
Eosinophils Relative: 3 %
HEMATOCRIT: 37 % (ref 35.0–47.0)
HEMOGLOBIN: 11.7 g/dL — AB (ref 12.0–16.0)
LYMPHS PCT: 44 %
Lymphs Abs: 2.9 10*3/uL (ref 1.0–3.6)
MCH: 21.1 pg — ABNORMAL LOW (ref 26.0–34.0)
MCHC: 31.7 g/dL — ABNORMAL LOW (ref 32.0–36.0)
MCV: 66.7 fL — AB (ref 80.0–100.0)
Monocytes Absolute: 0.4 10*3/uL (ref 0.2–0.9)
Monocytes Relative: 6 %
NEUTROS ABS: 3.1 10*3/uL (ref 1.4–6.5)
NEUTROS PCT: 47 %
PLATELETS: 355 10*3/uL (ref 150–440)
RBC: 5.55 MIL/uL — AB (ref 3.80–5.20)
RDW: 14.9 % — ABNORMAL HIGH (ref 11.5–14.5)
WBC: 6.5 10*3/uL (ref 3.6–11.0)

## 2016-07-15 LAB — COMPREHENSIVE METABOLIC PANEL
ALT: 55 U/L — AB (ref 14–54)
AST: 31 U/L (ref 15–41)
Albumin: 4.3 g/dL (ref 3.5–5.0)
Alkaline Phosphatase: 79 U/L (ref 38–126)
Anion gap: 10 (ref 5–15)
BILIRUBIN TOTAL: 0.9 mg/dL (ref 0.3–1.2)
BUN: 23 mg/dL — AB (ref 6–20)
CHLORIDE: 101 mmol/L (ref 101–111)
CO2: 25 mmol/L (ref 22–32)
CREATININE: 1.08 mg/dL — AB (ref 0.44–1.00)
Calcium: 10.2 mg/dL (ref 8.9–10.3)
GFR calc Af Amer: 60 mL/min — ABNORMAL LOW (ref >60)
GFR, EST NON AFRICAN AMERICAN: 52 mL/min — AB (ref >60)
Glucose, Bld: 204 mg/dL — ABNORMAL HIGH (ref 65–99)
Potassium: 4.1 mmol/L (ref 3.5–5.1)
Sodium: 136 mmol/L (ref 135–145)
Total Protein: 7.3 g/dL (ref 6.5–8.1)

## 2016-07-15 LAB — IRON AND TIBC
IRON: 70 ug/dL (ref 28–170)
SATURATION RATIOS: 20 % (ref 10.4–31.8)
TIBC: 356 ug/dL (ref 250–450)
UIBC: 286 ug/dL

## 2016-07-15 LAB — FERRITIN: FERRITIN: 289 ng/mL (ref 11–307)

## 2016-07-15 MED ORDER — HEPARIN SOD (PORK) LOCK FLUSH 100 UNIT/ML IV SOLN
500.0000 [IU] | Freq: Once | INTRAVENOUS | Status: AC
  Filled 2016-07-15: qty 5

## 2016-07-15 MED ORDER — SODIUM CHLORIDE 0.9% FLUSH
10.0000 mL | Freq: Once | INTRAVENOUS | Status: AC
  Filled 2016-07-15: qty 10

## 2016-07-15 NOTE — Assessment & Plan Note (Addendum)
#  Breast cancer ER/PR positive HER-2/neu negative- currently AI. Clinically no evidence of recurrence. Recent mammogram negative.  # Fatigue- ? Diabetes vs others. Check TSH. If not recommend follow up with PCP   # ? Memory issues- defer to PCP re: referarral to neurology.   # microcytic anemia- ? Congential causes- check Iron studies/ ferritin  # Follow up in 6 months/labs.

## 2016-07-15 NOTE — Progress Notes (Signed)
[name]  Patient Care Team: Nancy Axe, MD as PCP - General (Internal Medicine)  No matching staging information was found for the patient.   Oncology History   # 1996- Right breast. 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, [name], [name], PR less than 5%. HER-2 receptor not over expressed. 5. Status post mastectomy [Nancy Gaines] in January of 2014.  Sentinel lymph node was not identified.  (Patient had previous lymph node dissection from prior carcinoma of breast); [name], 2014) 7. Patient was started on Cytoxan and Taxotere January 11, 2013; Patient is on letrozole from April of 2014  # NOV 2016- BMD- Normal.   3. BRCA negative (verbal information from [place])        Breast cancer in female [facility])   07/13/2016 Initial Diagnosis    Breast cancer in female [facility])      Cancer of overlapping sites of left female breast ([place])   07/15/2016 Initial Diagnosis    Cancer of overlapping sites of left female breast [facility])       This is my first interaction with the patient as patient's primary oncologist has been Nancy Gaines. I reviewed the patient's prior charts/pertinent labs/imaging in detail; findings are summarized above.     INTERVAL HISTORY:  Nancy Gaines 70 y.o.  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  Patient denies any new lumps or bumps.  She complains of chronic hot flashes. Not any worse.  She does however complain of chronic fatigue; she also complains of memory problems in the last few months. She feels this is progressive. Denies any unusual bone pain. No weight loss.  REVIEW OF SYSTEMS:  A complete 10 point review of system is done which is negative except mentioned above/history of present illness.   PAST MEDICAL HISTORY :  Past Medical History:  Diagnosis Date  . Anemia   . Breast cancer ([place]) 2014   breast- chemo  . Cancer [facility]) 1996   breast cancer- radiation  . Degenerative joint disease   . Dermatophytosis of nail   . Diabetes mellitus without complication ([place])   . Diverticulosis   . Hypercholesterolemia   . Hypertension     PAST SURGICAL HISTORY :   Past Surgical History:  Procedure Laterality Date  . ABDOMINAL HYSTERECTOMY    . COLONOSCOPY WITH PROPOFOL N/A 08/29/2015   Procedure: COLONOSCOPY WITH PROPOFOL;  Surgeon: Nancy Luster, MD;  Location: [facility] ENDOSCOPY;  Service: Gastroenterology;  [name];  . DILATION AND CURETTAGE, DIAGNOSTIC / THERAPEUTIC    . MASTECTOMY Right    1996    FAMILY HISTORY :   Family History  Problem Relation Age of Onset  . Breast cancer Maternal Aunt   . Breast cancer Paternal Aunt     SOCIAL HISTORY:   Social History  Substance Use Topics  . Smoking status: Never Smoker  . Smokeless tobacco: Never Used  . Alcohol use  No    ALLERGIES:  is allergic to lipitor [atorvastatin].  MEDICATIONS:  Current Outpatient Prescriptions  Medication Sig Dispense Refill  . aspirin 81 MG tablet Take 81 mg by mouth daily.    . Calcium Carb-Cholecalciferol (CALCIUM 600+D3) 600-800 MG-UNIT TABS Take by mouth.    . Cholecalciferol (VITAMIN D3) 1000 UNITS CAPS Take by mouth.    . ferrous sulfate 325 (65 FE) MG tablet Take by mouth.    Marland Kitchen glimepiride (AMARYL) 4 MG tablet TAKE ONE TABLET BY MOUTH TWICE DAILY WITH A MEAL    . glucose blood (BAYER CONTOUR NEXT TEST) test strip  Use once daily. Use as instructed.    Marland Kitchen letrozole (FEMARA) 2.5 MG tablet Take 1 tablet (2.5 mg total) by mouth daily. 90 tablet 0  . lidocaine-prilocaine (EMLA) cream Apply 1 application topically as needed. 30 min prior to accessing port 30 g 3  . metFORMIN (GLUCOPHAGE) 1000 MG tablet Take by mouth.    . Multiple Vitamin (MULTI-VITAMINS) TABS Take by mouth.    . pravastatin (PRAVACHOL) 10 MG tablet Take 10 mg by mouth daily.    . valsartan-hydrochlorothiazide (DIOVAN-HCT) 320-25 MG per tablet      No current facility-administered medications for this visit.    Facility-Administered Medications Ordered in Other Visits  Medication Dose Route Frequency Provider Last Rate Last Dose  . heparin lock flush 100 unit/mL  500 Units Intravenous Once Nancy Sickle, MD      . sodium chloride flush (NS) 0.9 % injection 10 mL  10 mL Intravenous Once Nancy Sickle, MD      . sodium chloride flush (NS) 0.9 % injection 10 mL  10 mL Intravenous Once Nancy Sickle, MD        PHYSICAL EXAMINATION: ECOG PERFORMANCE STATUS: 0 - Asymptomatic  BP 139/84 (BP Location: Left Arm, Patient Position: Sitting)   Pulse 87   Temp (!) 95.7 F (35.4 C) (Tympanic)   Resp 17   Ht _0  (1.575 m)   Wt 213 lb (96.6 kg)   BMI 38.96 kg/m   Filed Weights   07/15/16 1012  Weight: 213 lb (96.6 kg)    GENERAL: Well-nourished well-developed; Alert, no distress and comfortable.   Alone. EYES: no pallor or icterus OROPHARYNX: no thrush or ulceration; good dentition  NECK: supple, no masses felt LYMPH:  no palpable lymphadenopathy in the cervical, axillary or inguinal regions LUNGS: clear to auscultation and  No wheeze or crackles HEART/CVS: regular rate & rhythm and no murmurs; No lower extremity edema ABDOMEN:abdomen soft, non-tender and normal bowel sounds Musculoskeletal:no cyanosis of digits and no clubbing  PSYCH: alert & oriented x 3 with fluent speech NEURO: no focal motor/sensory  deficits SKIN:  no rashes or significant lesions Right and left BREAST exam [in the presence of nurse]- no unusual skin changes or dominant masses felt. Surgical scars noted. Right mastectomy noted.  LABORATORY DATA:  I have reviewed the data as listed    Component Value Date/Time   NA 136 07/15/2016 0950   NA 141 05/06/2014 1039   K 4.1 07/15/2016 0950   K 4.4 05/06/2014 1039   CL 101 07/15/2016 0950   CL 104 05/06/2014 1039   CO2 25 07/15/2016 0950   CO2 30 05/06/2014 1039   GLUCOSE 204 (H) 07/15/2016 0950   GLUCOSE 184 (H) 05/06/2014 1039   BUN 23 (H) 07/15/2016 0950   BUN 21 (H) 05/06/2014 1039   CREATININE 1.08 (H) 07/15/2016 0950   CREATININE  1.04 05/06/2014 1039   CALCIUM 10.2 07/15/2016 0950   CALCIUM 9.7 05/06/2014 1039   PROT 7.3 07/15/2016 0950   PROT 7.3 05/06/2014 1039   ALBUMIN 4.3 07/15/2016 0950   ALBUMIN 3.7 05/06/2014 1039   AST 31 07/15/2016 0950   AST 21 05/06/2014 1039   ALT 55 (H) 07/15/2016 0950   ALT 63 05/06/2014 1039   ALKPHOS 79 07/15/2016 0950   ALKPHOS 102 05/06/2014 1039   BILITOT 0.9 07/15/2016 0950   BILITOT 0.7 05/06/2014 1039   GFRNONAA 52 (L) 07/15/2016 0950   GFRNONAA 56 (L) 05/06/2014 1039   GFRAA 60 (L) 07/15/2016 0950   GFRAA >60 05/06/2014 1039    No results found for: SPEP, UPEP  Lab Results  Component Value Date   WBC 6.5 07/15/2016   NEUTROABS 3.1 07/15/2016   HGB 11.7 (L) 07/15/2016   HCT 37.0 07/15/2016   MCV 66.7 (L) 07/15/2016   PLT 355 07/15/2016      Chemistry      Component Value Date/Time   NA 136 07/15/2016 0950   NA 141 05/06/2014 1039   K 4.1 07/15/2016 0950   K 4.4 05/06/2014 1039   CL 101 07/15/2016 0950   CL 104 05/06/2014 1039   CO2 25 07/15/2016 0950   CO2 30 05/06/2014 1039   BUN 23 (H) 07/15/2016 0950   BUN 21 (H) 05/06/2014 1039   CREATININE 1.08 (H) 07/15/2016 0950   CREATININE 1.04 05/06/2014 1039      Component Value Date/Time   CALCIUM 10.2 07/15/2016 0950   CALCIUM 9.7 05/06/2014  1039   ALKPHOS 79 07/15/2016 0950   ALKPHOS 102 05/06/2014 1039   AST 31 07/15/2016 0950   AST 21 05/06/2014 1039   ALT 55 (H) 07/15/2016 0950   ALT 63 05/06/2014 1039   BILITOT 0.9 07/15/2016 0950   BILITOT 0.7 05/06/2014 1039       RADIOGRAPHIC STUDIES: I have personally reviewed the radiological images as listed and agreed with the findings in the report. No results found.   ASSESSMENT & PLAN:  Cancer of overlapping sites of left female breast ([place]) # Breast cancer ER/PR positive HER-2/neu negative- currently AI. Clinically no evidence of recurrence. Recent mammogram negative.  # Fatigue- ? Diabetes vs others. Check TSH. If not recommend follow up with PCP   # ? Memory issues- defer to PCP re: referarral to neurology.   # microcytic anemia- ? Congential causes- check Iron studies/ ferritin  # Follow up in 6 months/labs.    Orders Placed This Encounter  Procedures  . MM Digital Screening Unilat L    Standing Status:   Future    Standing Expiration Date:   09/24/2017    Order Specific Question:   Reason for Exam (SYMPTOM  OR DIAGNOSIS REQUIRED)    Answer:   Hx breast cancer    Order Specific Question:   Preferred imaging location?    Answer:   [facility]  . CBC with Differential    Standing Status:   Future    Standing Expiration Date:   07/15/2017  . Comprehensive metabolic panel    Standing Status:   Future    Standing Expiration Date:   07/15/2017  . Ferritin    Standing Status:   Future    Number of Occurrences:   1    Standing Expiration Date:   07/15/2017  . Iron and TIBC    Standing Status:   Future    Number of Occurrences:  1    Standing Expiration Date:   07/15/2017   All questions were answered. The patient knows to call the clinic with any problems, questions or concerns.      Nancy Sickle, MD 07/18/2016 11:58 AM

## 2016-07-15 NOTE — Progress Notes (Signed)
Pt reports increasing fatigue, and hot flashes.  Pt reports forgetfulness.  Pt reports her legs are tired and thinks its related to medication.

## 2016-07-16 ENCOUNTER — Inpatient Hospital Stay: Payer: Medicare Other

## 2016-07-16 DIAGNOSIS — C50911 Malignant neoplasm of unspecified site of right female breast: Secondary | ICD-10-CM | POA: Diagnosis not present

## 2016-07-16 DIAGNOSIS — Z95828 Presence of other vascular implants and grafts: Secondary | ICD-10-CM

## 2016-07-16 MED ORDER — SODIUM CHLORIDE 0.9% FLUSH
10.0000 mL | INTRAVENOUS | Status: DC | PRN
Start: 2016-07-16 — End: 2016-07-16
  Administered 2016-07-16: 10 mL via INTRAVENOUS
  Filled 2016-07-16: qty 10

## 2016-07-16 MED ORDER — HEPARIN SOD (PORK) LOCK FLUSH 100 UNIT/ML IV SOLN
500.0000 [IU] | Freq: Once | INTRAVENOUS | Status: AC
  Administered 2016-07-16: 500 [IU] via INTRAVENOUS

## 2016-07-27 ENCOUNTER — Other Ambulatory Visit: Payer: Self-pay | Admitting: *Deleted

## 2016-07-27 MED ORDER — LETROZOLE 2.5 MG PO TABS: 2.5000 mg | ORAL_TABLET | Freq: Every day | ORAL | 1 refills | Status: DC

## 2016-09-14 ENCOUNTER — Inpatient Hospital Stay: Payer: Medicare Other | Attending: Internal Medicine

## 2016-09-14 DIAGNOSIS — C50911 Malignant neoplasm of unspecified site of right female breast: Secondary | ICD-10-CM | POA: Diagnosis present

## 2016-09-14 DIAGNOSIS — Z923 Personal history of irradiation: Secondary | ICD-10-CM | POA: Insufficient documentation

## 2016-09-14 DIAGNOSIS — Z9221 Personal history of antineoplastic chemotherapy: Secondary | ICD-10-CM | POA: Diagnosis not present

## 2016-09-14 DIAGNOSIS — Z9011 Acquired absence of right breast and nipple: Secondary | ICD-10-CM | POA: Diagnosis not present

## 2016-09-14 DIAGNOSIS — Z95828 Presence of other vascular implants and grafts: Secondary | ICD-10-CM

## 2016-09-14 DIAGNOSIS — Z170 Estrogen receptor positive status [ER+]: Secondary | ICD-10-CM | POA: Insufficient documentation

## 2016-09-14 DIAGNOSIS — Z452 Encounter for adjustment and management of vascular access device: Secondary | ICD-10-CM | POA: Insufficient documentation

## 2016-09-14 MED ORDER — SODIUM CHLORIDE 0.9% FLUSH
10.0000 mL | INTRAVENOUS | Status: DC | PRN
Start: 2016-09-14 — End: 2016-09-14
  Administered 2016-09-14: 10 mL via INTRAVENOUS
  Filled 2016-09-14: qty 10

## 2016-09-14 MED ORDER — HEPARIN SOD (PORK) LOCK FLUSH 100 UNIT/ML IV SOLN
500.0000 [IU] | Freq: Once | INTRAVENOUS | Status: AC
  Administered 2016-09-14: 500 [IU] via INTRAVENOUS

## 2016-09-29 ENCOUNTER — Ambulatory Visit
Admission: RE | Admit: 2016-09-29 | Discharge: 2016-09-29 | Disposition: A | Discharge status: Home / Self Care | Payer: Medicare Other | Source: Ambulatory Visit | Attending: Internal Medicine | Admitting: Internal Medicine

## 2016-09-29 DIAGNOSIS — Z1231 Encounter for screening mammogram for malignant neoplasm of breast: Secondary | ICD-10-CM | POA: Insufficient documentation

## 2016-09-29 DIAGNOSIS — C50812 Malignant neoplasm of overlapping sites of left female breast: Secondary | ICD-10-CM

## 2016-09-29 HISTORY — DX: Personal history of antineoplastic chemotherapy: Z92.21

## 2016-09-29 HISTORY — DX: Personal history of irradiation: Z92.3

## 2016-10-27 ENCOUNTER — Inpatient Hospital Stay: Payer: Medicare Other | Attending: Internal Medicine

## 2016-10-27 DIAGNOSIS — Z95828 Presence of other vascular implants and grafts: Secondary | ICD-10-CM

## 2016-10-27 DIAGNOSIS — Z170 Estrogen receptor positive status [ER+]: Secondary | ICD-10-CM | POA: Insufficient documentation

## 2016-10-27 DIAGNOSIS — Z452 Encounter for adjustment and management of vascular access device: Secondary | ICD-10-CM | POA: Diagnosis not present

## 2016-10-27 DIAGNOSIS — Z9011 Acquired absence of right breast and nipple: Secondary | ICD-10-CM | POA: Diagnosis not present

## 2016-10-27 DIAGNOSIS — C50911 Malignant neoplasm of unspecified site of right female breast: Secondary | ICD-10-CM | POA: Insufficient documentation

## 2016-10-27 DIAGNOSIS — Z9221 Personal history of antineoplastic chemotherapy: Secondary | ICD-10-CM | POA: Diagnosis not present

## 2016-10-27 DIAGNOSIS — Z923 Personal history of irradiation: Secondary | ICD-10-CM | POA: Insufficient documentation

## 2016-10-27 MED ORDER — HEPARIN SOD (PORK) LOCK FLUSH 100 UNIT/ML IV SOLN
500.0000 [IU] | Freq: Once | INTRAVENOUS | Status: AC
  Administered 2016-10-27: 500 [IU] via INTRAVENOUS

## 2016-10-27 MED ORDER — SODIUM CHLORIDE 0.9% FLUSH
10.0000 mL | INTRAVENOUS | Status: DC | PRN
  Administered 2016-10-27: 10 mL via INTRAVENOUS
  Filled 2016-10-27: qty 10

## 2016-11-12 IMAGING — CT CT HEAD W/O CM
1 series · 16 of 30 positions shown, 20 images · non-contrast
Comparison: None.

CLINICAL DATA: Headaches for 1 week

EXAM:
CT HEAD WITHOUT CONTRAST
TECHNIQUE: Contiguous axial images were obtained from the base of the skull
through the vertex without intravenous contrast.

[Series 2: head wo · axial · 0.42mm/px · z∈[+79,+212]mm · 16 of 30 slices shown, 20 images]
[im 2/30  brain]
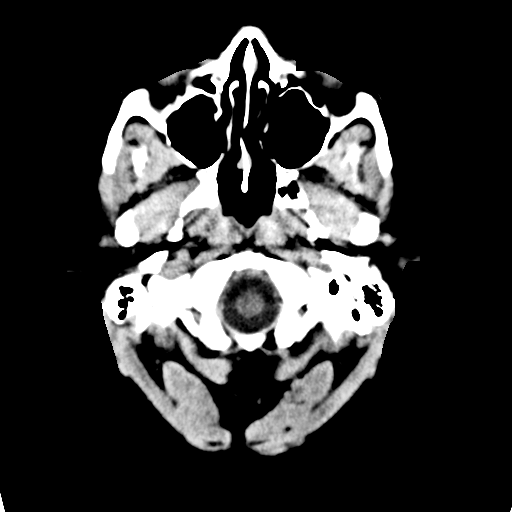
[im 2/30  bone]
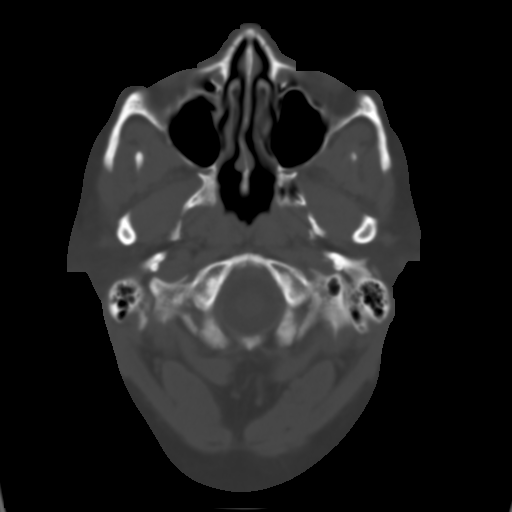
[im 4/30  brain]
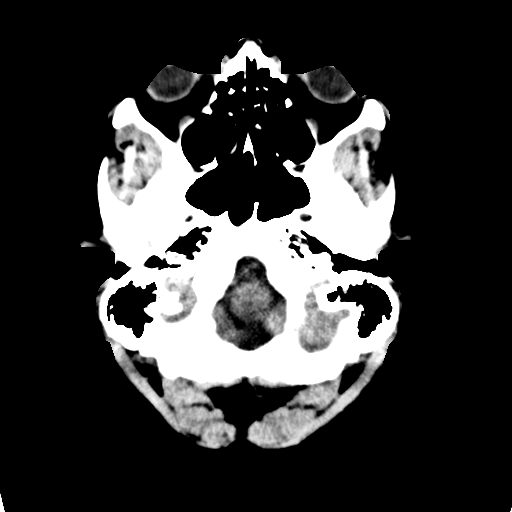
[im 6/30  brain]
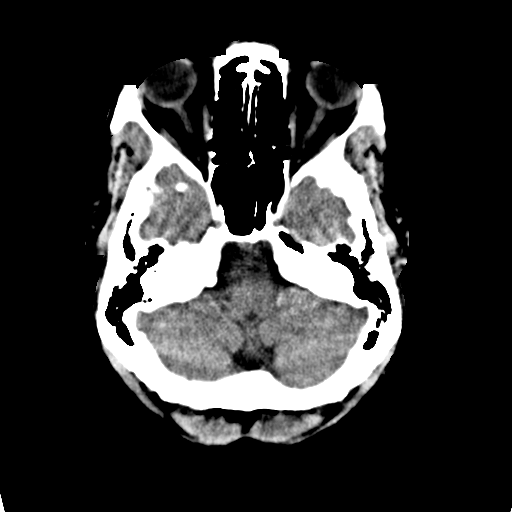
[im 8/30  brain]
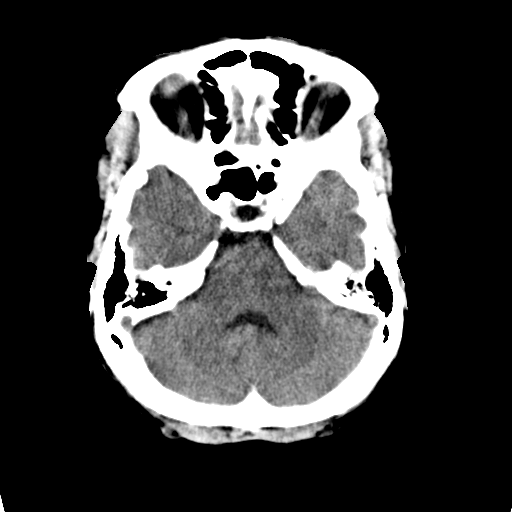
[im 9/30  brain]
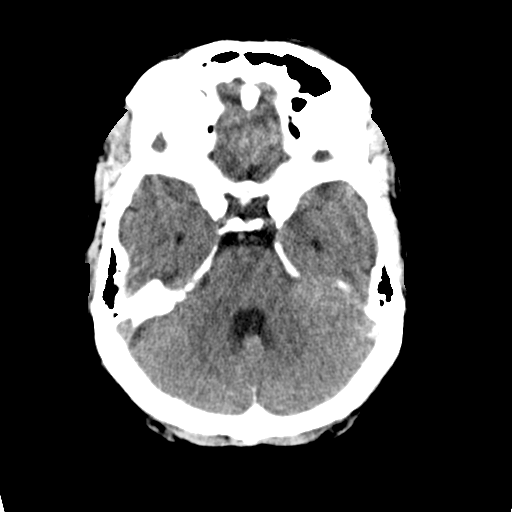
[im 9/30  bone]
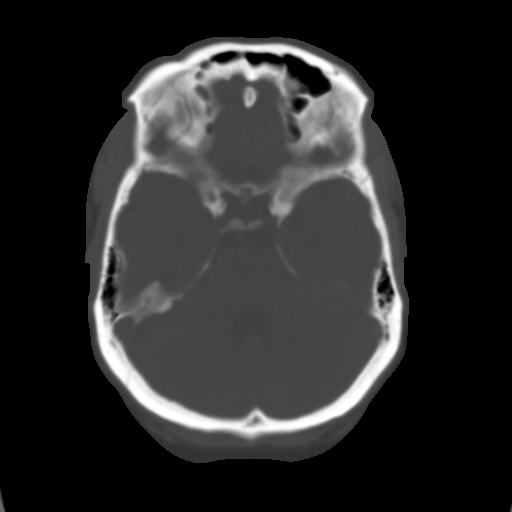
[im 11/30  brain]
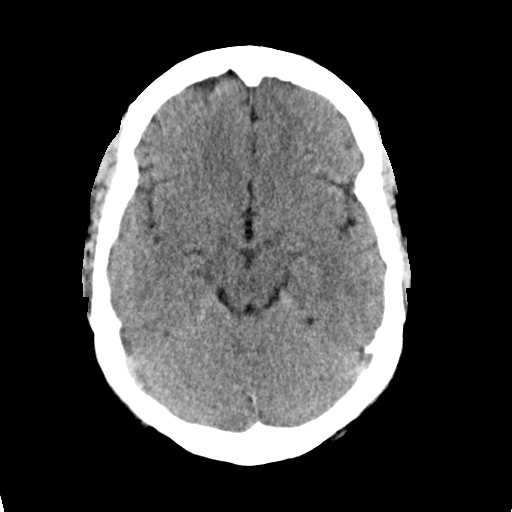
[im 13/30  brain]
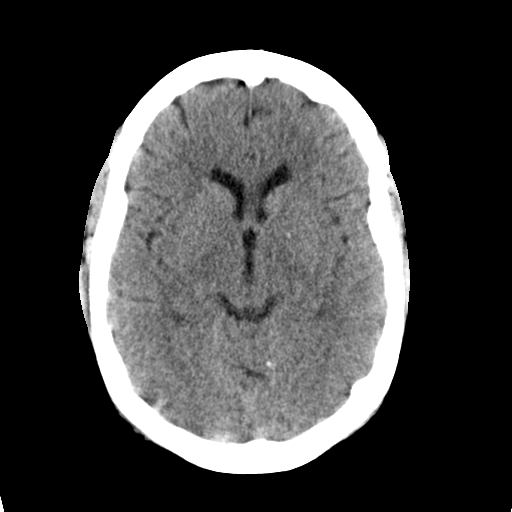
[im 15/30  brain]
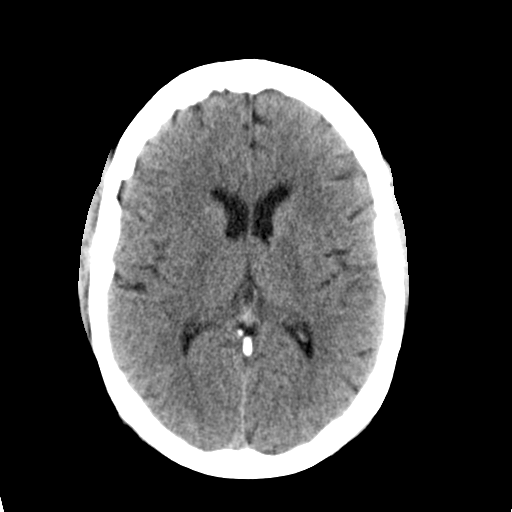
[im 16/30  brain]
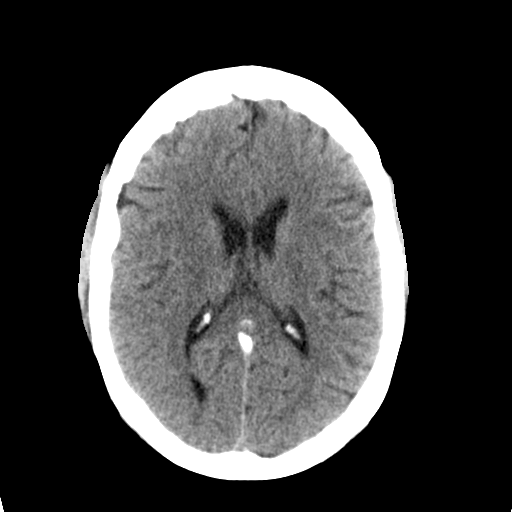
[im 16/30  bone]
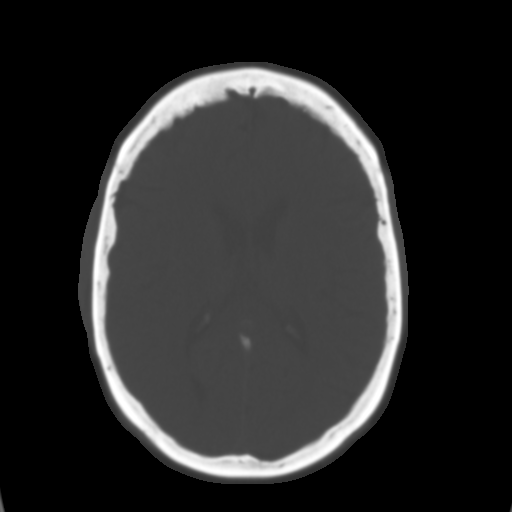
[im 18/30  brain]
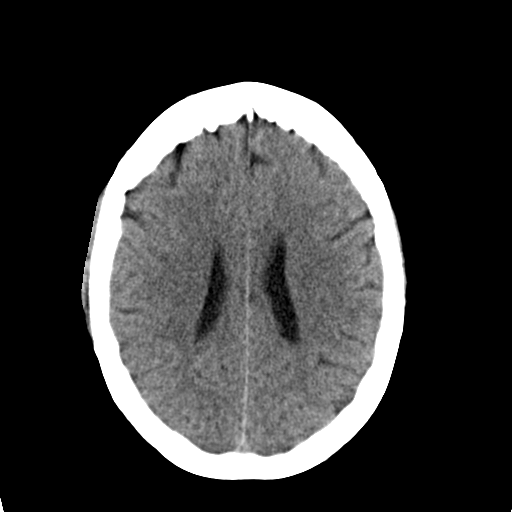
[im 20/30  brain]
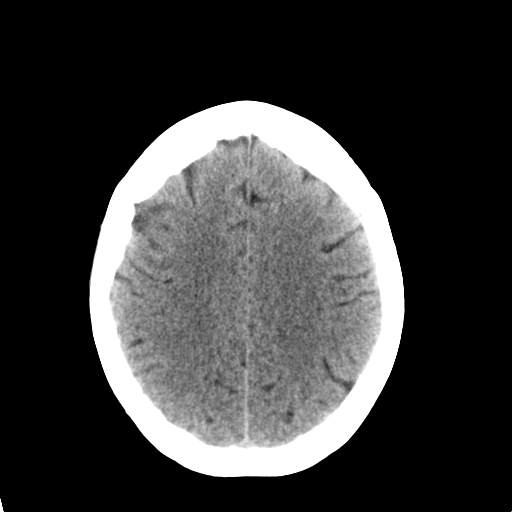
[im 22/30  brain]
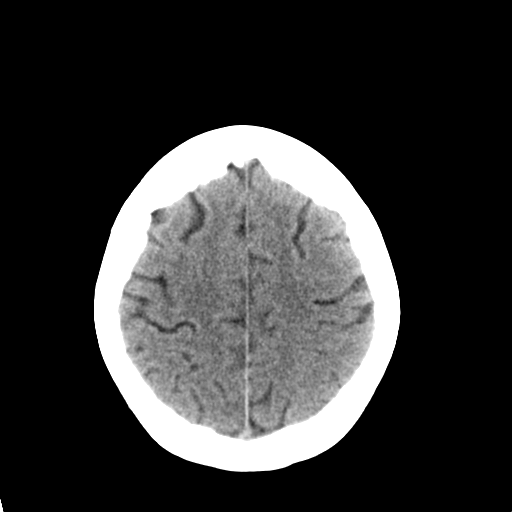
[im 23/30  brain]
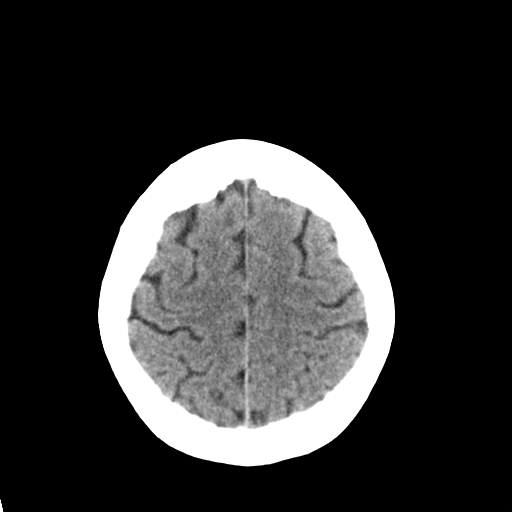
[im 23/30  bone]
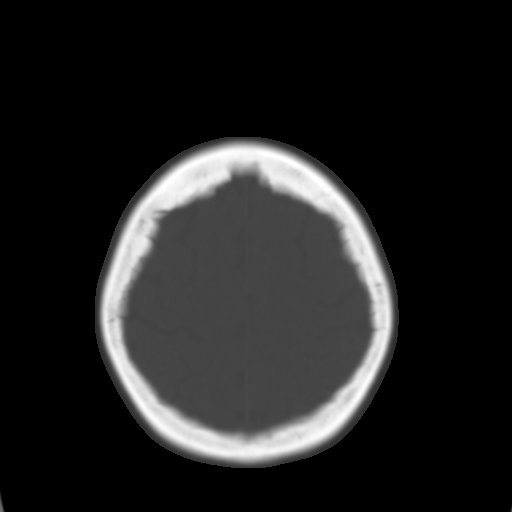
[im 25/30  brain]
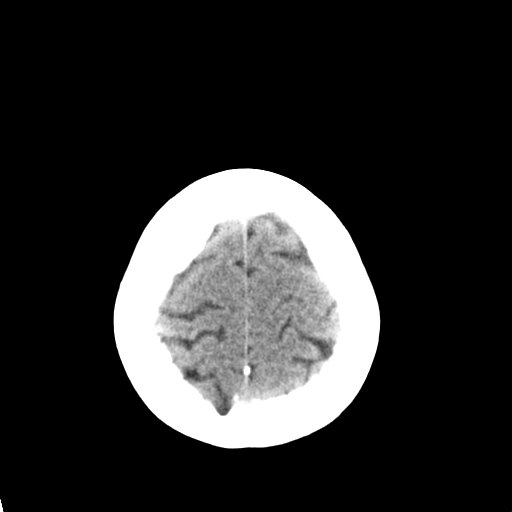
[im 27/30  brain]
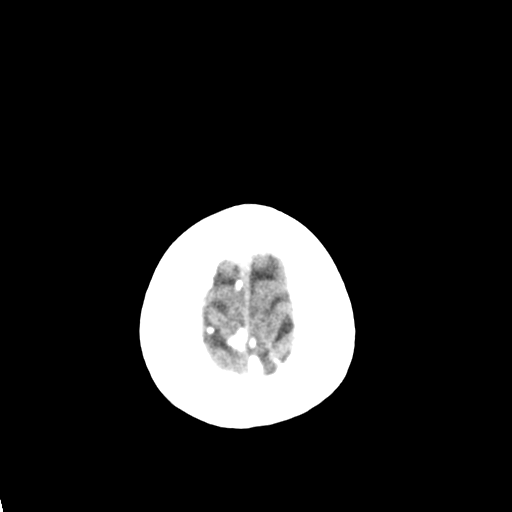
[im 29/30  brain]
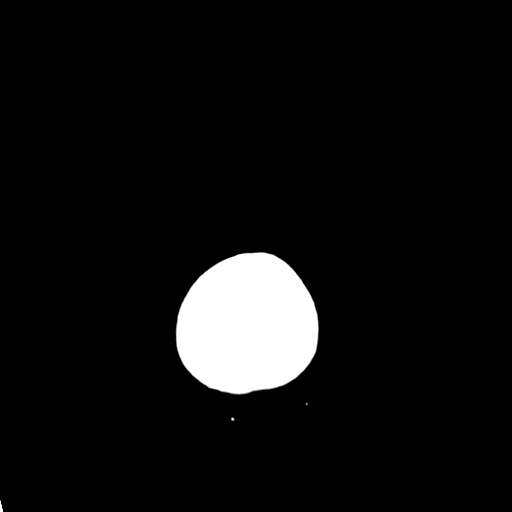

[16 of 30 positions shown; findings below may reference images not displayed]

FINDINGS: The bony calvarium is intact. The ventricles are of normal size and
configuration. No findings to suggest acute hemorrhage, acute
infarction or space-occupying mass lesion are noted.
IMPRESSION: No acute intracranial abnormality noted.

## 2016-12-07 ENCOUNTER — Inpatient Hospital Stay: Payer: Medicare Other | Attending: Internal Medicine

## 2016-12-07 DIAGNOSIS — Z9011 Acquired absence of right breast and nipple: Secondary | ICD-10-CM | POA: Insufficient documentation

## 2016-12-07 DIAGNOSIS — Z923 Personal history of irradiation: Secondary | ICD-10-CM | POA: Diagnosis not present

## 2016-12-07 DIAGNOSIS — Z95828 Presence of other vascular implants and grafts: Secondary | ICD-10-CM

## 2016-12-07 DIAGNOSIS — Z170 Estrogen receptor positive status [ER+]: Secondary | ICD-10-CM | POA: Diagnosis not present

## 2016-12-07 DIAGNOSIS — Z9221 Personal history of antineoplastic chemotherapy: Secondary | ICD-10-CM | POA: Insufficient documentation

## 2016-12-07 DIAGNOSIS — Z452 Encounter for adjustment and management of vascular access device: Secondary | ICD-10-CM | POA: Insufficient documentation

## 2016-12-07 DIAGNOSIS — C50911 Malignant neoplasm of unspecified site of right female breast: Secondary | ICD-10-CM | POA: Diagnosis present

## 2016-12-07 MED ORDER — HEPARIN SOD (PORK) LOCK FLUSH 100 UNIT/ML IV SOLN
500.0000 [IU] | Freq: Once | INTRAVENOUS | Status: AC
  Administered 2016-12-07: 500 [IU] via INTRAVENOUS

## 2016-12-07 MED ORDER — SODIUM CHLORIDE 0.9% FLUSH
10.0000 mL | INTRAVENOUS | Status: DC | PRN
  Administered 2016-12-07: 10 mL via INTRAVENOUS
  Filled 2016-12-07: qty 10

## 2017-01-12 ENCOUNTER — Ambulatory Visit: Payer: Medicare Other | Admitting: Internal Medicine

## 2017-01-12 ENCOUNTER — Other Ambulatory Visit: Payer: Medicare Other

## 2017-01-14 ENCOUNTER — Inpatient Hospital Stay: Payer: Medicare Other

## 2017-01-14 ENCOUNTER — Inpatient Hospital Stay: Payer: Medicare Other | Attending: Internal Medicine | Admitting: Internal Medicine

## 2017-01-14 VITALS — BP 135/87 | HR 82 | Temp 96.9°F | Resp 18 | Wt 211.5 lb

## 2017-01-14 DIAGNOSIS — Z7984 Long term (current) use of oral hypoglycemic drugs: Secondary | ICD-10-CM | POA: Diagnosis not present

## 2017-01-14 DIAGNOSIS — Z170 Estrogen receptor positive status [ER+]: Secondary | ICD-10-CM | POA: Diagnosis not present

## 2017-01-14 DIAGNOSIS — Z7982 Long term (current) use of aspirin: Secondary | ICD-10-CM | POA: Insufficient documentation

## 2017-01-14 DIAGNOSIS — E119 Type 2 diabetes mellitus without complications: Secondary | ICD-10-CM | POA: Insufficient documentation

## 2017-01-14 DIAGNOSIS — Z923 Personal history of irradiation: Secondary | ICD-10-CM | POA: Diagnosis not present

## 2017-01-14 DIAGNOSIS — Z9221 Personal history of antineoplastic chemotherapy: Secondary | ICD-10-CM

## 2017-01-14 DIAGNOSIS — E7800 Pure hypercholesterolemia, unspecified: Secondary | ICD-10-CM | POA: Insufficient documentation

## 2017-01-14 DIAGNOSIS — Z79811 Long term (current) use of aromatase inhibitors: Secondary | ICD-10-CM

## 2017-01-14 DIAGNOSIS — I10 Essential (primary) hypertension: Secondary | ICD-10-CM | POA: Insufficient documentation

## 2017-01-14 DIAGNOSIS — C50812 Malignant neoplasm of overlapping sites of left female breast: Secondary | ICD-10-CM | POA: Diagnosis present

## 2017-01-14 DIAGNOSIS — D509 Iron deficiency anemia, unspecified: Secondary | ICD-10-CM | POA: Insufficient documentation

## 2017-01-14 LAB — CBC WITH DIFFERENTIAL/PLATELET
Basophils Absolute: 0 10*3/uL (ref 0–0.1)
Basophils Relative: 1 %
Eosinophils Absolute: 0.1 10*3/uL (ref 0–0.7)
Eosinophils Relative: 1 %
HEMATOCRIT: 35.7 % (ref 35.0–47.0)
Hemoglobin: 11.3 g/dL — ABNORMAL LOW (ref 12.0–16.0)
LYMPHS ABS: 2.5 10*3/uL (ref 1.0–3.6)
LYMPHS PCT: 40 %
MCH: 21.1 pg — AB (ref 26.0–34.0)
MCHC: 31.6 g/dL — AB (ref 32.0–36.0)
MCV: 66.9 fL — AB (ref 80.0–100.0)
MONOS PCT: 6 %
Monocytes Absolute: 0.4 10*3/uL (ref 0.2–0.9)
NEUTROS ABS: 3.3 10*3/uL (ref 1.4–6.5)
Neutrophils Relative %: 52 %
Platelets: 367 10*3/uL (ref 150–440)
RBC: 5.33 MIL/uL — ABNORMAL HIGH (ref 3.80–5.20)
RDW: 15 % — ABNORMAL HIGH (ref 11.5–14.5)
WBC: 6.4 10*3/uL (ref 3.6–11.0)

## 2017-01-14 LAB — COMPREHENSIVE METABOLIC PANEL
ALBUMIN: 4.3 g/dL (ref 3.5–5.0)
ALK PHOS: 77 U/L (ref 38–126)
ALT: 35 U/L (ref 14–54)
AST: 25 U/L (ref 15–41)
Anion gap: 7 (ref 5–15)
BUN: 24 mg/dL — ABNORMAL HIGH (ref 6–20)
CO2: 27 mmol/L (ref 22–32)
Calcium: 10 mg/dL (ref 8.9–10.3)
Chloride: 102 mmol/L (ref 101–111)
Creatinine, Ser: 0.97 mg/dL (ref 0.44–1.00)
GFR calc Af Amer: >60 mL/min (ref >60)
GFR calc non Af Amer: 59 mL/min — ABNORMAL LOW (ref >60)
Glucose, Bld: 195 mg/dL — ABNORMAL HIGH (ref 65–99)
Potassium: 4.3 mmol/L (ref 3.5–5.1)
SODIUM: 136 mmol/L (ref 135–145)
TOTAL PROTEIN: 7.3 g/dL (ref 6.5–8.1)
Total Bilirubin: 0.8 mg/dL (ref 0.3–1.2)

## 2017-01-14 NOTE — Assessment & Plan Note (Addendum)
#  Breast cancer ER/PR positive HER-2/neu negative- currently letrozole [since April 2014].  Clinically no evidence of recurrence. Nov 2017- mammogram negative. Continue letrozole for now.   # Fatigue- ? Diabetes vs others.TSH- may 2017- N; recommend follow up with PCP   # Diabetes- poorly controlled. Blood sugar 195; question cause of her fatigue. Recommend follow up with PCP locally.  # ? Memory issues- defer to PCP re: referarral to neurology.   # microcytic anemia- ? Congential causes- Iron studies/ ferritin sep 2017- NOT iron deficient. 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 Hb electrophoresis at next visit.   # Follow up in 6 months/labs.    Cc: Dr.Singh.

## 2017-01-14 NOTE — Progress Notes (Signed)
[facility] OFFICE PROGRESS NOTE  Patient Care Team: Glendon Axe, MD as PCP - General (Internal Medicine)  Cancer Staging No matching staging information was found for the patient.   Oncology History   # 1996- Right breast. 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, [name], [name], [name]; [name], 2014) 7. Patient was started on Cytoxan and Taxotere January 11, 2013; Patient is on letrozole from April of 2014  # NOV 2016- BMD- Normal.   3. BRCA negative (verbal information from [place])        Breast cancer in female [facility])   07/13/2016 Initial Diagnosis    Breast cancer in female [facility])      Carcinoma of overlapping sites of left breast in female, estrogen receptor positive ([place])   07/15/2016 Initial Diagnosis    Cancer of overlapping sites of left female breast [facility])       INTERVAL HISTORY:  ALPHA CHOUINARD 71 y.o.  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  Patient continues to complain of fatigue. She also complains of memory issues for the last few years. Denies any headaches. Denies any bone pain. Denies any weight loss. Patient denies any new  lumps or bumps. She complains of chronic hot flashes. Not any worse.   REVIEW OF SYSTEMS:  A complete 10 point review of system is done which is negative except mentioned above/history of present illness.   PAST MEDICAL HISTORY :  Past Medical History:  Diagnosis Date  . Anemia   . Breast cancer ([place]) 2014   breast- chemo  . Cancer [facility]) 1996   breast cancer- radiation  . Degenerative joint disease   . Dermatophytosis of nail   . Diabetes mellitus without complication ([place])   . Diverticulosis   . Hypercholesterolemia   . Hypertension   . Personal history of chemotherapy 2014   BREAST CA  . Personal history of radiation therapy 1996   BREAST CA    PAST SURGICAL HISTORY :   Past Surgical History:  Procedure Laterality Date  . ABDOMINAL HYSTERECTOMY    . COLONOSCOPY WITH PROPOFOL N/A 08/29/2015   Procedure: COLONOSCOPY WITH PROPOFOL;  [name], MD;  Location: [facility] ENDOSCOPY;  Service: Gastroenterology;  [name];  . DILATION AND CURETTAGE, DIAGNOSTIC / THERAPEUTIC    . MASTECTOMY Right 1996   BREAST CA    FAMILY HISTORY :   Family History  Problem Relation Age of Onset  . Breast cancer Maternal Aunt   . Breast cancer Paternal Aunt   . Breast cancer Sister 21    SOCIAL HISTORY:   Social History  Substance Use Topics  . Smoking status: Never Smoker  . Smokeless tobacco: Never Used  . Alcohol  use No    ALLERGIES:  is allergic to lipitor [atorvastatin].  MEDICATIONS:  Current Outpatient Prescriptions  Medication Sig Dispense Refill  . aspirin 81 MG tablet Take 81 mg by mouth daily.    . Calcium Carb-Cholecalciferol (CALCIUM 600+D3) 600-800 MG-UNIT TABS Take by mouth.    . Cholecalciferol (VITAMIN D3) 1000 UNITS CAPS Take by mouth.    . ferrous sulfate 325 (65 FE) MG tablet Take by mouth.    Marland Kitchen glimepiride (AMARYL) 4 MG tablet TAKE ONE TABLET BY MOUTH TWICE DAILY WITH A MEAL    . letrozole (FEMARA) 2.5 MG tablet Take 1 tablet (2.5 mg total) by  mouth daily. 90 tablet 1  . lidocaine-prilocaine (EMLA) cream Apply 1 application topically as needed. 30 min prior to accessing port 30 g 3  . Multiple Vitamin (MULTI-VITAMINS) TABS Take by mouth.    . pravastatin (PRAVACHOL) 10 MG tablet Take 10 mg by mouth daily.    . valsartan-hydrochlorothiazide (DIOVAN-HCT) 320-25 MG per tablet Take 1 tablet by mouth daily.     Marland Kitchen glucose blood (BAYER CONTOUR NEXT TEST) test strip Use once daily. Use as instructed.    . metFORMIN (GLUCOPHAGE) 1000 MG tablet Take by mouth.     No current facility-administered medications for this visit.    Facility-Administered Medications Ordered in Other Visits  Medication Dose Route Frequency Provider Last Rate Last Dose  . heparin lock flush 100 unit/mL  500 Units Intravenous Once Cammie Sickle, MD      . sodium chloride flush (NS) 0.9 % injection 10 mL  10 mL Intravenous Once Cammie Sickle, MD      . sodium chloride flush (NS) 0.9 % injection 10 mL  10 mL Intravenous Once Cammie Sickle, MD        PHYSICAL EXAMINATION: ECOG PERFORMANCE STATUS: 0 - Asymptomatic  BP 135/87 (BP Location: Left Arm, Patient Position: Sitting)   Pulse 82   Temp (!) 96.9 F (36.1 C) (Tympanic)   Resp 18   Wt 211 lb 8 oz (95.9 kg)   BMI 38.68 kg/m   Filed Weights   01/14/17 1117  Weight: 211 lb 8 oz (95.9 kg)    GENERAL: Well-nourished well-developed; Alert, no distress and comfortable.   Alone. EYES: no pallor or icterus OROPHARYNX: no thrush or ulceration; good dentition  NECK: supple, no masses felt LYMPH:  no palpable lymphadenopathy in the cervical, axillary or inguinal regions LUNGS: clear to auscultation and  No wheeze or crackles HEART/CVS: regular rate & rhythm and no murmurs; No lower extremity edema ABDOMEN:abdomen soft, non-tender and normal bowel sounds Musculoskeletal:no cyanosis of digits and no clubbing  PSYCH: alert & oriented x 3 with fluent speech NEURO: no focal motor/sensory  deficits SKIN:  no rashes or significant lesions Right and left BREAST exam [in the presence of nurse]- no unusual skin changes or dominant masses felt. Surgical scars noted. Right mastectomy noted.  LABORATORY DATA:  I have reviewed the data as listed    Component Value Date/Time   NA 136 01/14/2017 1033   NA 141 05/06/2014 1039   K 4.3 01/14/2017 1033   K 4.4 05/06/2014 1039   CL 102 01/14/2017 1033   CL 104 05/06/2014 1039   CO2 27 01/14/2017 1033   CO2 30 05/06/2014 1039   GLUCOSE 195 (H) 01/14/2017 1033   GLUCOSE 184 (H) 05/06/2014 1039   BUN 24 (H) 01/14/2017 1033   BUN 21 (H) 05/06/2014 1039   CREATININE 0.97 01/14/2017 1033  CREATININE 1.04 05/06/2014 1039   CALCIUM 10.0 01/14/2017 1033   CALCIUM 9.7 05/06/2014 1039   PROT 7.3 01/14/2017 1033   PROT 7.3 05/06/2014 1039   ALBUMIN 4.3 01/14/2017 1033   ALBUMIN 3.7 05/06/2014 1039   AST 25 01/14/2017 1033   AST 21 05/06/2014 1039   ALT 35 01/14/2017 1033   ALT 63 05/06/2014 1039   ALKPHOS 77 01/14/2017 1033   ALKPHOS 102 05/06/2014 1039   BILITOT 0.8 01/14/2017 1033   BILITOT 0.7 05/06/2014 1039   GFRNONAA 59 (L) 01/14/2017 1033   GFRNONAA 56 (L) 05/06/2014 1039   GFRAA >60 01/14/2017 1033   GFRAA >60 05/06/2014 1039    No results found for: SPEP, UPEP  Lab Results  Component Value Date   WBC 6.4 01/14/2017   NEUTROABS 3.3 01/14/2017   HGB 11.3 (L) 01/14/2017   HCT 35.7 01/14/2017   MCV 66.9 (L) 01/14/2017   PLT 367 01/14/2017      Chemistry      Component Value Date/Time   NA 136 01/14/2017 1033   NA 141 05/06/2014 1039   K 4.3 01/14/2017 1033   K 4.4 05/06/2014 1039   CL 102 01/14/2017 1033   CL 104 05/06/2014 1039   CO2 27 01/14/2017 1033   CO2 30 05/06/2014 1039   BUN 24 (H) 01/14/2017 1033   BUN 21 (H) 05/06/2014 1039   CREATININE 0.97 01/14/2017 1033   CREATININE 1.04 05/06/2014 1039      Component Value Date/Time   CALCIUM 10.0 01/14/2017 1033   CALCIUM 9.7 05/06/2014 1039    ALKPHOS 77 01/14/2017 1033   ALKPHOS 102 05/06/2014 1039   AST 25 01/14/2017 1033   AST 21 05/06/2014 1039   ALT 35 01/14/2017 1033   ALT 63 05/06/2014 1039   BILITOT 0.8 01/14/2017 1033   BILITOT 0.7 05/06/2014 1039       RADIOGRAPHIC STUDIES: I have personally reviewed the radiological images as listed and agreed with the findings in the report. No results found.   ASSESSMENT & PLAN:  Carcinoma of overlapping sites of left breast in female, estrogen receptor positive ([place]) # Breast cancer ER/PR positive HER-2/neu negative- currently letrozole [since April 2014].  Clinically no evidence of recurrence. Nov 2017- mammogram negative. Continue letrozole for now.   # Fatigue- ? Diabetes vs others.TSH- may 2017- N; recommend follow up with PCP   # Diabetes- poorly controlled. Blood sugar 195; question cause of her fatigue. Recommend follow up with PCP locally.  # ? Memory issues- defer to PCP re: referarral to neurology.   # microcytic anemia- ? Congential causes- Iron studies/ ferritin sep 2017- NOT iron deficient. 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 Hb electrophoresis at next visit.   # Follow up in 6 months/labs.    Cc: Dr.Singh.    Orders Placed This Encounter  Procedures  . CBC with Differential/Platelet    Standing Status:   Future    Standing Expiration Date:   10/16/2017  . Comprehensive metabolic panel    Standing Status:   Future    Standing Expiration Date:   10/16/2017  . Hemoglobinopathy evaluation    Standing Status:   Future    Standing Expiration Date:   01/14/2018   All questions were answered. The patient knows to call the clinic with any problems, questions or concerns.      Cammie Sickle, MD 01/14/2017 1:33 PM

## 2017-01-25 ENCOUNTER — Other Ambulatory Visit: Payer: Self-pay | Admitting: Internal Medicine

## 2017-02-01 ENCOUNTER — Inpatient Hospital Stay: Payer: Medicare Other | Attending: Internal Medicine

## 2017-02-01 DIAGNOSIS — Z9221 Personal history of antineoplastic chemotherapy: Secondary | ICD-10-CM | POA: Diagnosis not present

## 2017-02-01 DIAGNOSIS — Z923 Personal history of irradiation: Secondary | ICD-10-CM | POA: Insufficient documentation

## 2017-02-01 DIAGNOSIS — Z170 Estrogen receptor positive status [ER+]: Secondary | ICD-10-CM | POA: Diagnosis not present

## 2017-02-01 DIAGNOSIS — Z452 Encounter for adjustment and management of vascular access device: Secondary | ICD-10-CM | POA: Diagnosis not present

## 2017-02-01 DIAGNOSIS — Z9011 Acquired absence of right breast and nipple: Secondary | ICD-10-CM | POA: Diagnosis not present

## 2017-02-01 DIAGNOSIS — Z95828 Presence of other vascular implants and grafts: Secondary | ICD-10-CM

## 2017-02-01 DIAGNOSIS — C50911 Malignant neoplasm of unspecified site of right female breast: Secondary | ICD-10-CM | POA: Insufficient documentation

## 2017-02-01 MED ORDER — SODIUM CHLORIDE 0.9% FLUSH
10.0000 mL | INTRAVENOUS | Status: DC | PRN
  Administered 2017-02-01: 10 mL via INTRAVENOUS
  Filled 2017-02-01: qty 10

## 2017-02-01 MED ORDER — HEPARIN SOD (PORK) LOCK FLUSH 100 UNIT/ML IV SOLN
500.0000 [IU] | Freq: Once | INTRAVENOUS | Status: AC
  Administered 2017-02-01: 500 [IU] via INTRAVENOUS

## 2017-03-29 ENCOUNTER — Inpatient Hospital Stay: Payer: Medicare Other | Attending: Internal Medicine

## 2017-04-01 ENCOUNTER — Inpatient Hospital Stay: Payer: Medicare Other | Attending: Internal Medicine

## 2017-04-01 DIAGNOSIS — Z9221 Personal history of antineoplastic chemotherapy: Secondary | ICD-10-CM | POA: Insufficient documentation

## 2017-04-01 DIAGNOSIS — Z452 Encounter for adjustment and management of vascular access device: Secondary | ICD-10-CM | POA: Insufficient documentation

## 2017-04-01 DIAGNOSIS — Z9011 Acquired absence of right breast and nipple: Secondary | ICD-10-CM | POA: Insufficient documentation

## 2017-04-01 DIAGNOSIS — Z923 Personal history of irradiation: Secondary | ICD-10-CM | POA: Insufficient documentation

## 2017-04-01 DIAGNOSIS — Z170 Estrogen receptor positive status [ER+]: Secondary | ICD-10-CM | POA: Insufficient documentation

## 2017-04-01 DIAGNOSIS — C50911 Malignant neoplasm of unspecified site of right female breast: Secondary | ICD-10-CM | POA: Insufficient documentation

## 2017-04-01 MED ORDER — HEPARIN SOD (PORK) LOCK FLUSH 100 UNIT/ML IV SOLN: 500.0000 [IU] | Freq: Once | INTRAVENOUS | Status: AC

## 2017-04-01 MED ORDER — SODIUM CHLORIDE 0.9% FLUSH
10.0000 mL | INTRAVENOUS | Status: AC | PRN
  Filled 2017-04-01: qty 10

## 2017-04-05 ENCOUNTER — Inpatient Hospital Stay: Payer: Medicare Other

## 2017-04-05 DIAGNOSIS — Z95828 Presence of other vascular implants and grafts: Secondary | ICD-10-CM

## 2017-04-05 DIAGNOSIS — Z9011 Acquired absence of right breast and nipple: Secondary | ICD-10-CM | POA: Diagnosis not present

## 2017-04-05 DIAGNOSIS — Z9221 Personal history of antineoplastic chemotherapy: Secondary | ICD-10-CM | POA: Diagnosis not present

## 2017-04-05 DIAGNOSIS — C50911 Malignant neoplasm of unspecified site of right female breast: Secondary | ICD-10-CM | POA: Diagnosis present

## 2017-04-05 DIAGNOSIS — Z452 Encounter for adjustment and management of vascular access device: Secondary | ICD-10-CM | POA: Diagnosis not present

## 2017-04-05 DIAGNOSIS — Z170 Estrogen receptor positive status [ER+]: Secondary | ICD-10-CM | POA: Diagnosis not present

## 2017-04-05 DIAGNOSIS — Z923 Personal history of irradiation: Secondary | ICD-10-CM | POA: Diagnosis not present

## 2017-04-05 MED ORDER — SODIUM CHLORIDE 0.9% FLUSH
10.0000 mL | INTRAVENOUS | Status: DC | PRN
  Administered 2017-04-05: 10 mL via INTRAVENOUS
  Filled 2017-04-05: qty 10

## 2017-04-05 MED ORDER — HEPARIN SOD (PORK) LOCK FLUSH 100 UNIT/ML IV SOLN
500.0000 [IU] | Freq: Once | INTRAVENOUS | Status: AC
Start: 2017-04-05 — End: 2017-04-05
  Administered 2017-04-05: 500 [IU] via INTRAVENOUS

## 2017-05-24 ENCOUNTER — Inpatient Hospital Stay: Payer: Medicare Other | Attending: Internal Medicine

## 2017-06-02 ENCOUNTER — Inpatient Hospital Stay: Payer: Medicare Other | Attending: Internal Medicine

## 2017-06-02 DIAGNOSIS — Z9221 Personal history of antineoplastic chemotherapy: Secondary | ICD-10-CM | POA: Diagnosis not present

## 2017-06-02 DIAGNOSIS — Z9011 Acquired absence of right breast and nipple: Secondary | ICD-10-CM | POA: Insufficient documentation

## 2017-06-02 DIAGNOSIS — Z170 Estrogen receptor positive status [ER+]: Secondary | ICD-10-CM | POA: Diagnosis not present

## 2017-06-02 DIAGNOSIS — Z452 Encounter for adjustment and management of vascular access device: Secondary | ICD-10-CM | POA: Insufficient documentation

## 2017-06-02 DIAGNOSIS — Z95828 Presence of other vascular implants and grafts: Secondary | ICD-10-CM

## 2017-06-02 DIAGNOSIS — Z923 Personal history of irradiation: Secondary | ICD-10-CM | POA: Insufficient documentation

## 2017-06-02 DIAGNOSIS — C50911 Malignant neoplasm of unspecified site of right female breast: Secondary | ICD-10-CM | POA: Diagnosis present

## 2017-06-02 MED ORDER — HEPARIN SOD (PORK) LOCK FLUSH 100 UNIT/ML IV SOLN
500.0000 [IU] | Freq: Once | INTRAVENOUS | Status: AC
  Administered 2017-06-02: 500 [IU] via INTRAVENOUS

## 2017-06-02 MED ORDER — SODIUM CHLORIDE 0.9% FLUSH
10.0000 mL | INTRAVENOUS | Status: DC | PRN
  Administered 2017-06-02: 10 mL via INTRAVENOUS
  Filled 2017-06-02: qty 10

## 2017-07-18 ENCOUNTER — Inpatient Hospital Stay: Payer: Medicare Other | Attending: Internal Medicine

## 2017-07-18 ENCOUNTER — Inpatient Hospital Stay (HOSPITAL_BASED_OUTPATIENT_CLINIC_OR_DEPARTMENT_OTHER): Payer: Medicare Other | Admitting: Internal Medicine

## 2017-07-18 VITALS — BP 162/97 | HR 88 | Temp 97.8°F | Resp 20 | Ht 62.0 in | Wt 209.0 lb

## 2017-07-18 DIAGNOSIS — E669 Obesity, unspecified: Secondary | ICD-10-CM | POA: Insufficient documentation

## 2017-07-18 DIAGNOSIS — C50812 Malignant neoplasm of overlapping sites of left female breast: Secondary | ICD-10-CM

## 2017-07-18 DIAGNOSIS — N951 Menopausal and female climacteric states: Secondary | ICD-10-CM | POA: Insufficient documentation

## 2017-07-18 DIAGNOSIS — I10 Essential (primary) hypertension: Secondary | ICD-10-CM | POA: Insufficient documentation

## 2017-07-18 DIAGNOSIS — E119 Type 2 diabetes mellitus without complications: Secondary | ICD-10-CM

## 2017-07-18 DIAGNOSIS — D509 Iron deficiency anemia, unspecified: Secondary | ICD-10-CM

## 2017-07-18 DIAGNOSIS — Z79811 Long term (current) use of aromatase inhibitors: Secondary | ICD-10-CM | POA: Diagnosis not present

## 2017-07-18 DIAGNOSIS — Z7984 Long term (current) use of oral hypoglycemic drugs: Secondary | ICD-10-CM | POA: Insufficient documentation

## 2017-07-18 DIAGNOSIS — E7800 Pure hypercholesterolemia, unspecified: Secondary | ICD-10-CM | POA: Insufficient documentation

## 2017-07-18 DIAGNOSIS — Z95828 Presence of other vascular implants and grafts: Secondary | ICD-10-CM

## 2017-07-18 DIAGNOSIS — Z170 Estrogen receptor positive status [ER+]: Secondary | ICD-10-CM | POA: Diagnosis not present

## 2017-07-18 DIAGNOSIS — Z7982 Long term (current) use of aspirin: Secondary | ICD-10-CM | POA: Insufficient documentation

## 2017-07-18 LAB — CBC WITH DIFFERENTIAL/PLATELET
BASOS ABS: 0 10*3/uL (ref 0–0.1)
Basophils Relative: 1 %
Eosinophils Absolute: 0.1 10*3/uL (ref 0–0.7)
Eosinophils Relative: 1 %
HEMATOCRIT: 34.7 % — AB (ref 35.0–47.0)
HEMOGLOBIN: 11.1 g/dL — AB (ref 12.0–16.0)
LYMPHS PCT: 42 %
Lymphs Abs: 2.8 10*3/uL (ref 1.0–3.6)
MCH: 21.1 pg — ABNORMAL LOW (ref 26.0–34.0)
MCHC: 31.9 g/dL — ABNORMAL LOW (ref 32.0–36.0)
MCV: 66.4 fL — ABNORMAL LOW (ref 80.0–100.0)
Monocytes Absolute: 0.4 10*3/uL (ref 0.2–0.9)
Monocytes Relative: 6 %
Neutro Abs: 3.4 10*3/uL (ref 1.4–6.5)
Neutrophils Relative %: 50 %
PLATELETS: 335 10*3/uL (ref 150–440)
RBC: 5.23 MIL/uL — AB (ref 3.80–5.20)
RDW: 14.8 % — ABNORMAL HIGH (ref 11.5–14.5)
WBC: 6.7 10*3/uL (ref 3.6–11.0)

## 2017-07-18 LAB — COMPREHENSIVE METABOLIC PANEL
ALT: 34 U/L (ref 14–54)
AST: 25 U/L (ref 15–41)
Albumin: 3.9 g/dL (ref 3.5–5.0)
Alkaline Phosphatase: 82 U/L (ref 38–126)
Anion gap: 11 (ref 5–15)
BILIRUBIN TOTAL: 0.9 mg/dL (ref 0.3–1.2)
BUN: 27 mg/dL — AB (ref 6–20)
CHLORIDE: 102 mmol/L (ref 101–111)
CO2: 25 mmol/L (ref 22–32)
CREATININE: 1.15 mg/dL — AB (ref 0.44–1.00)
Calcium: 9.9 mg/dL (ref 8.9–10.3)
GFR calc Af Amer: 55 mL/min — ABNORMAL LOW (ref >60)
GFR, EST NON AFRICAN AMERICAN: 47 mL/min — AB (ref >60)
Glucose, Bld: 181 mg/dL — ABNORMAL HIGH (ref 65–99)
Potassium: 4 mmol/L (ref 3.5–5.1)
Sodium: 138 mmol/L (ref 135–145)
Total Protein: 7 g/dL (ref 6.5–8.1)

## 2017-07-18 MED ORDER — HEPARIN SOD (PORK) LOCK FLUSH 100 UNIT/ML IV SOLN
500.0000 [IU] | Freq: Once | INTRAVENOUS | Status: AC
  Administered 2017-07-18: 500 [IU] via INTRAVENOUS

## 2017-07-18 MED ORDER — SODIUM CHLORIDE 0.9% FLUSH
10.0000 mL | INTRAVENOUS | Status: DC | PRN
  Administered 2017-07-18: 10 mL via INTRAVENOUS
  Filled 2017-07-18: qty 10

## 2017-07-18 NOTE — Assessment & Plan Note (Addendum)
#  RIGHT BREAST IPISILATERAL RECURRENCE- STAGE I Breast cancer ER/PR positive HER-2/neu negative- currently letrozole [since April 2014].  Clinically no evidence of recurrence. Continue letrozole for now. Question- 5 versus 10 years of antihormone therapy. Would recommend BCI tetsing at next visit  # Hot flashes- chronic- from aromatase inhibitor. Discussed regarding Effexor. Patient not too keen.   # Diabetes- poorly controlled. Blood sugar 183 question cause of her fatigue. Recommend follow up with PCP.   # Obesity- discussed the role of healthy nutrition/exercise in helping her weight; and also discussed the benefit in prevention of breast cancer recurrence. Interested in weight loss pills; defer to her PCP  # Port flush- every 2 months.   # microcytic anemia- ? Congential causes- Iron studies/ ferritin sep 2017- NOT iron deficient. 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 Hb electrophoresis-pending.   # BMD- normal [2016]; again thru PCP in 2 years.   # Follow up in 6 months/labs.Mammo- PCP.     Cc: Dr.Singh.

## 2017-07-18 NOTE — Progress Notes (Signed)
RN Chaperoned provider with Breast Exam.   

## 2017-07-18 NOTE — Progress Notes (Signed)
[place] OFFICE PROGRESS NOTE  Patient Care Team: Glendon Axe, MD as PCP - General (Internal Medicine)  Cancer Staging No matching staging information was found for the patient.   Oncology History   # 1996- Right breast. 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, [name], [name], [name]; [name], 2014) 7. Patient was started on Cytoxan and Taxotere January 11, 2013; Patient is on letrozole from April of 2014  # NOV 2016- BMD- Normal.   3. BRCA negative (verbal information from [place])        Carcinoma of overlapping sites of left breast in female, estrogen receptor positive ([place])     INTERVAL HISTORY:  Nancy Gaines 71 y.o.  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  Patient continues to have intermittent chronic hot flashes/sweats this is not any worse.  Denies any headaches. Denies any bone pain. Denies any weight loss. Patient denies any new lumps or bumps. She recently had her blood pressure medications changed due to recall.  REVIEW OF SYSTEMS:  A complete 10 point review of system is done which is negative except mentioned above/history of  present illness.   PAST MEDICAL HISTORY :  Past Medical History:  Diagnosis Date  . Anemia   . Breast cancer ([place]) 2014   breast- chemo  . Cancer [facility]) 1996   breast cancer- radiation  . Degenerative joint disease   . Dermatophytosis of nail   . Diabetes mellitus without complication ([place])   . Diverticulosis   . Hypercholesterolemia   . Hypertension   . Personal history of chemotherapy 2014   BREAST CA  . Personal history of radiation therapy 1996   BREAST CA    PAST SURGICAL HISTORY :   Past Surgical History:  Procedure Laterality Date  . ABDOMINAL HYSTERECTOMY    . COLONOSCOPY WITH PROPOFOL N/A 08/29/2015   Procedure: COLONOSCOPY WITH PROPOFOL;  [name], MD;  Location: [facility] ENDOSCOPY;  Service: Gastroenterology;  [name];  . DILATION AND CURETTAGE, DIAGNOSTIC / THERAPEUTIC    . MASTECTOMY Right 1996   BREAST CA    FAMILY HISTORY :   Family History  Problem Relation Age of Onset  . Breast cancer Maternal Aunt   . Breast cancer Paternal Aunt   . Breast cancer Sister 28    SOCIAL HISTORY:   Social History  Substance Use Topics  . Smoking status: Never Smoker  . Smokeless tobacco: Never Used  . Alcohol use No    ALLERGIES:  is allergic to lipitor [atorvastatin].  MEDICATIONS:  Current Outpatient Prescriptions  Medication Sig Dispense Refill  . aspirin 81 MG tablet Take 81 mg by mouth daily.    . Calcium Carb-Cholecalciferol (CALCIUM 600+D3) 600-800  MG-UNIT TABS Take by mouth.    . Cholecalciferol (VITAMIN D3) 1000 UNITS CAPS Take by mouth.    . ferrous sulfate 325 (65 FE) MG tablet Take 325 mg by mouth daily with breakfast.     . glimepiride (AMARYL) 4 MG tablet TAKE ONE TABLET BY MOUTH TWICE DAILY WITH A MEAL    . glucose blood (BAYER CONTOUR NEXT TEST) test strip Use once daily. Use as instructed.    Marland Kitchen letrozole (FEMARA) 2.5 MG tablet TAKE ONE TABLET BY MOUTH ONCE DAILY 90 tablet 1  . lidocaine-prilocaine (EMLA) cream Apply 1 application  topically as needed. 30 min prior to accessing port 30 g 3  . losartan-hydrochlorothiazide (HYZAAR) 100-12.5 MG tablet Take 1 tablet by mouth daily.    . metFORMIN (GLUCOPHAGE) 1000 MG tablet Take 1,000 mg by mouth 2 (two) times daily with a meal.     . Multiple Vitamin (MULTI-VITAMINS) TABS Take by mouth.    . pravastatin (PRAVACHOL) 10 MG tablet Take 10 mg by mouth daily.     No current facility-administered medications for this visit.    Facility-Administered Medications Ordered in Other Visits  Medication Dose Route Frequency Provider Last Rate Last Dose  . heparin lock flush 100 unit/mL  500 Units Intravenous Once Charlaine Dalton R, MD      . heparin lock flush 100 unit/mL  500 Units Intravenous Once Charlaine Dalton R, MD      . sodium chloride flush (NS) 0.9 % injection 10 mL  10 mL Intravenous Once Charlaine Dalton R, MD      . sodium chloride flush (NS) 0.9 % injection 10 mL  10 mL Intravenous Once Charlaine Dalton R, MD      . sodium chloride flush (NS) 0.9 % injection 10 mL  10 mL Intravenous PRN Cammie Sickle, MD        PHYSICAL EXAMINATION: ECOG PERFORMANCE STATUS: 0 - Asymptomatic  BP (!) 162/97 (BP Location: Left Arm, Patient Position: Sitting)   Pulse 88   Temp 97.8 F (36.6 C) (Tympanic)   Resp 20   Ht _0  (1.575 m)   Wt 209 lb (94.8 kg)   BMI 38.23 kg/m   Filed Weights   07/18/17 1059  Weight: 209 lb (94.8 kg)    GENERAL: Well-nourished well-developed; Alert, no distress and comfortable.   Alone. EYES: no pallor or icterus OROPHARYNX: no thrush or ulceration; good dentition  NECK: supple, no masses felt LYMPH:  no palpable lymphadenopathy in the cervical, axillary or inguinal regions LUNGS: clear to auscultation and  No wheeze or crackles HEART/CVS: regular rate & rhythm and no murmurs; No lower extremity edema ABDOMEN:abdomen soft, non-tender and normal bowel sounds Musculoskeletal:no cyanosis of digits and no clubbing  PSYCH:  alert & oriented x 3 with fluent speech NEURO: no focal motor/sensory deficits SKIN:  no rashes or significant lesions Right and left BREAST exam [in the presence of nurse]- no unusual skin changes or dominant masses felt. Surgical scars noted. Right mastectomy noted.  LABORATORY DATA:  I have reviewed the data as listed    Component Value Date/Time   NA 138 07/18/2017 1012   NA 141 05/06/2014 1039   K 4.0 07/18/2017 1012   K 4.4 05/06/2014 1039   CL 102 07/18/2017 1012   CL 104 05/06/2014 1039   CO2 25 07/18/2017 1012   CO2 30 05/06/2014 1039   GLUCOSE 181 (H) 07/18/2017 1012   GLUCOSE 184 (H) 05/06/2014 1039   BUN 27 (H)  07/18/2017 1012   BUN 21 (H) 05/06/2014 1039   CREATININE 1.15 (H) 07/18/2017 1012   CREATININE 1.04 05/06/2014 1039   CALCIUM 9.9 07/18/2017 1012   CALCIUM 9.7 05/06/2014 1039   PROT 7.0 07/18/2017 1012   PROT 7.3 05/06/2014 1039   ALBUMIN 3.9 07/18/2017 1012   ALBUMIN 3.7 05/06/2014 1039   AST 25 07/18/2017 1012   AST 21 05/06/2014 1039   ALT 34 07/18/2017 1012   ALT 63 05/06/2014 1039   ALKPHOS 82 07/18/2017 1012   ALKPHOS 102 05/06/2014 1039   BILITOT 0.9 07/18/2017 1012   BILITOT 0.7 05/06/2014 1039   GFRNONAA 47 (L) 07/18/2017 1012   GFRNONAA 56 (L) 05/06/2014 1039   GFRAA 55 (L) 07/18/2017 1012   GFRAA >60 05/06/2014 1039    No results found for: SPEP, UPEP  Lab Results  Component Value Date   WBC 6.7 07/18/2017   NEUTROABS 3.4 07/18/2017   HGB 11.1 (L) 07/18/2017   HCT 34.7 (L) 07/18/2017   MCV 66.4 (L) 07/18/2017   PLT 335 07/18/2017      Chemistry      Component Value Date/Time   NA 138 07/18/2017 1012   NA 141 05/06/2014 1039   K 4.0 07/18/2017 1012   K 4.4 05/06/2014 1039   CL 102 07/18/2017 1012   CL 104 05/06/2014 1039   CO2 25 07/18/2017 1012   CO2 30 05/06/2014 1039   BUN 27 (H) 07/18/2017 1012   BUN 21 (H) 05/06/2014 1039   CREATININE 1.15 (H) 07/18/2017 1012   CREATININE 1.04 05/06/2014 1039      Component  Value Date/Time   CALCIUM 9.9 07/18/2017 1012   CALCIUM 9.7 05/06/2014 1039   ALKPHOS 82 07/18/2017 1012   ALKPHOS 102 05/06/2014 1039   AST 25 07/18/2017 1012   AST 21 05/06/2014 1039   ALT 34 07/18/2017 1012   ALT 63 05/06/2014 1039   BILITOT 0.9 07/18/2017 1012   BILITOT 0.7 05/06/2014 1039     IMPRESSION: No mammographic evidence of malignancy. A result letter of this screening mammogram will be mailed directly to the patient.  RECOMMENDATION: Screening mammogram in one year.  (Code:[id number])  BI-RADS CATEGORY  1: Negative.   Electronically Signed   By: Pamelia Hoit M.D.   On: 09/29/2016 17:42  RADIOGRAPHIC STUDIES: I have personally reviewed the radiological images as listed and agreed with the findings in the report. No results found.   ASSESSMENT & PLAN:  Carcinoma of overlapping sites of left breast in female, estrogen receptor positive ([place]) # RIGHT BREAST IPISILATERAL RECURRENCE- STAGE I Breast cancer ER/PR positive HER-2/neu negative- currently letrozole [since April 2014].  Clinically no evidence of recurrence. Continue letrozole for now. Question- 5 versus 10 years of antihormone therapy. Would recommend BCI tetsing at next visit  # Hot flashes- chronic- from aromatase inhibitor. Discussed regarding Effexor. Patient not too keen.   # Diabetes- poorly controlled. Blood sugar 183 question cause of her fatigue. Recommend follow up with PCP.   # Obesity- discussed the role of healthy nutrition/exercise in helping her weight; and also discussed the benefit in prevention of breast cancer recurrence. Interested in weight loss pills; defer to her PCP  # Port flush- every 2 months.   # microcytic anemia- ? Congential causes- Iron studies/ ferritin sep 2017- NOT iron deficient. 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 Hb electrophoresis-pending.   # BMD- normal [2016]; again thru PCP in 2 years.   # Follow up in 6 months/labs.Mammo- PCP.  Cc: Dr.Singh.    Orders Placed This  Encounter  Procedures  . CBC with Differential/Platelet    Standing Status:   Future    Standing Expiration Date:   07/18/2018  . Comprehensive metabolic panel    Standing Status:   Future    Standing Expiration Date:   07/18/2018   All questions were answered. The patient knows to call the clinic with any problems, questions or concerns.      Cammie Sickle, MD 07/18/2017 12:45 PM

## 2017-07-19 ENCOUNTER — Inpatient Hospital Stay: Payer: Medicare Other

## 2017-07-19 LAB — HEMOGLOBINOPATHY EVALUATION
HGB A2 QUANT: 1.8 % (ref 1.8–3.2)
HGB A: 98.2 % (ref 96.4–98.8)
HGB F QUANT: 0 % (ref 0.0–2.0)
HGB S QUANTITAION: 0 %
HGB VARIANT: 0 %
Hgb C: 0 %

## 2017-07-21 ENCOUNTER — Other Ambulatory Visit: Payer: Self-pay | Admitting: Internal Medicine

## 2017-08-29 ENCOUNTER — Inpatient Hospital Stay: Payer: Medicare Other | Attending: Internal Medicine

## 2017-09-01 IMAGING — MG MM DIGITAL SCREENING UNILAT*L* W/ CAD
2 series · 2 of 2 positions shown · non-contrast
Comparison: Previous exam(s).

CLINICAL DATA: Screening.

EXAM:
DIGITAL SCREENING UNILATERAL LEFT MAMMOGRAM WITH CAD

[L CC]
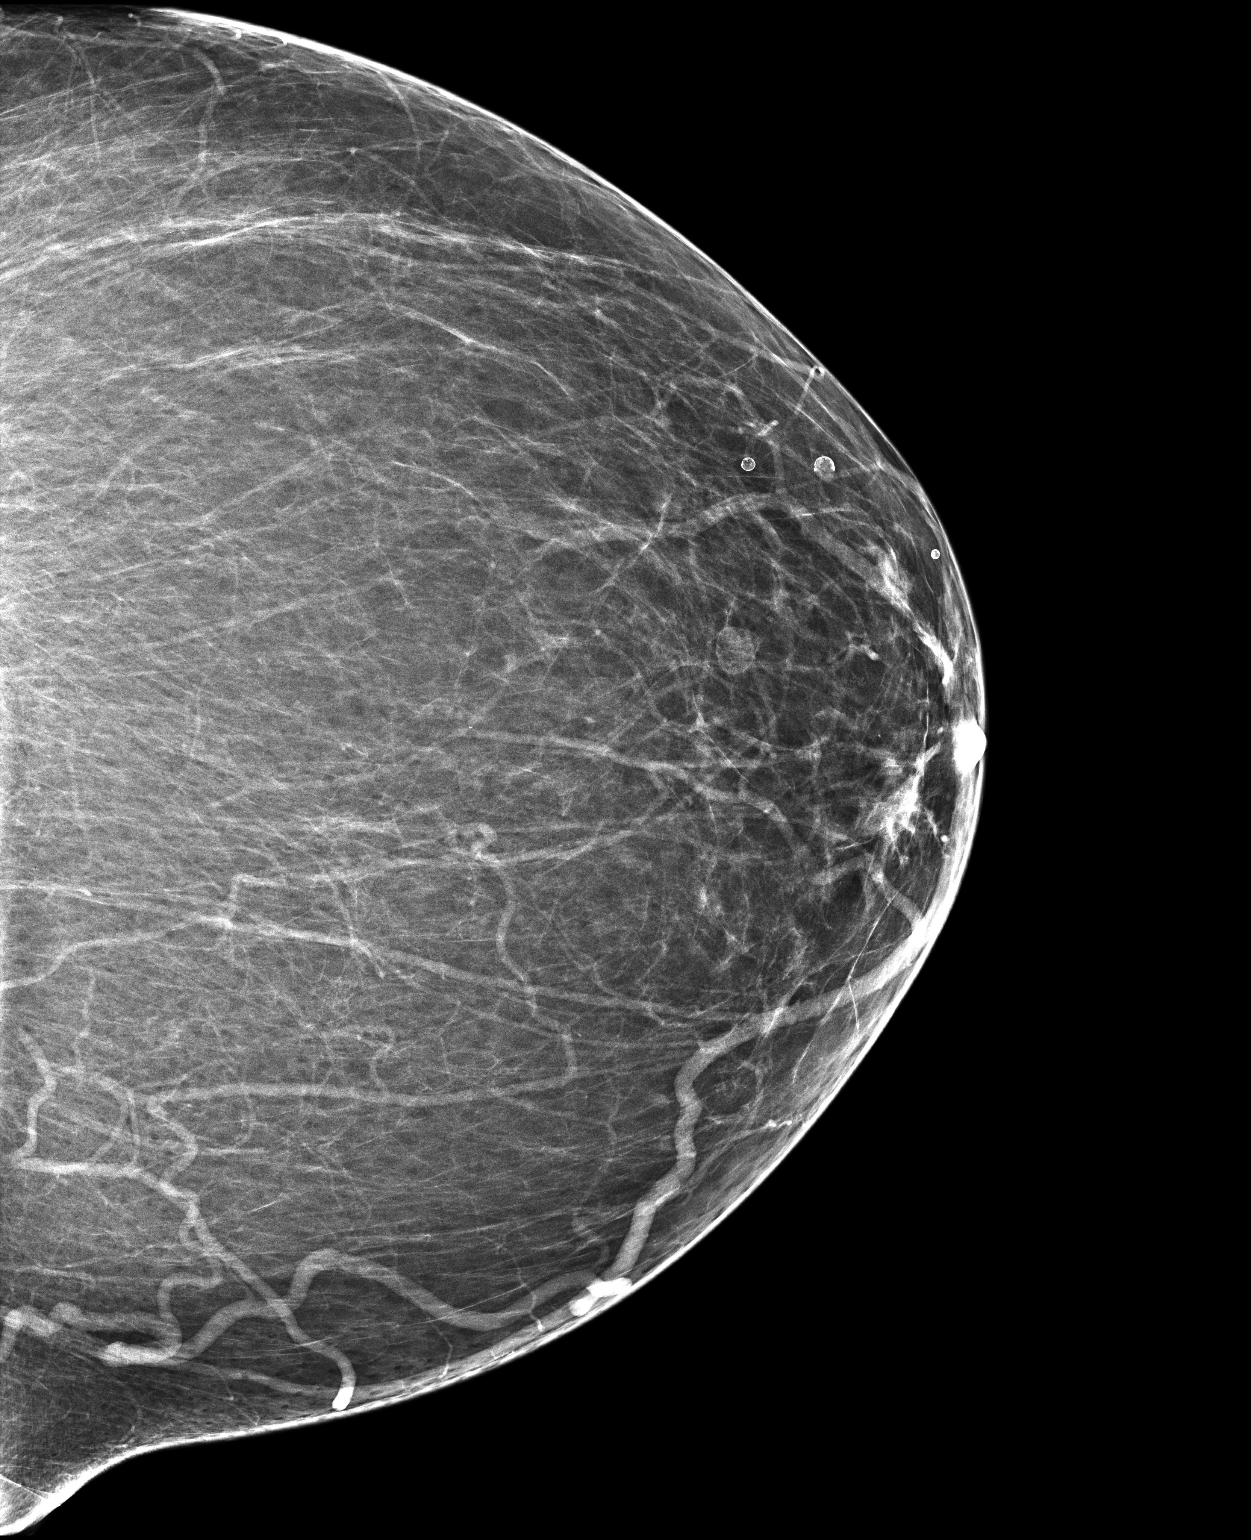

[L MLO]
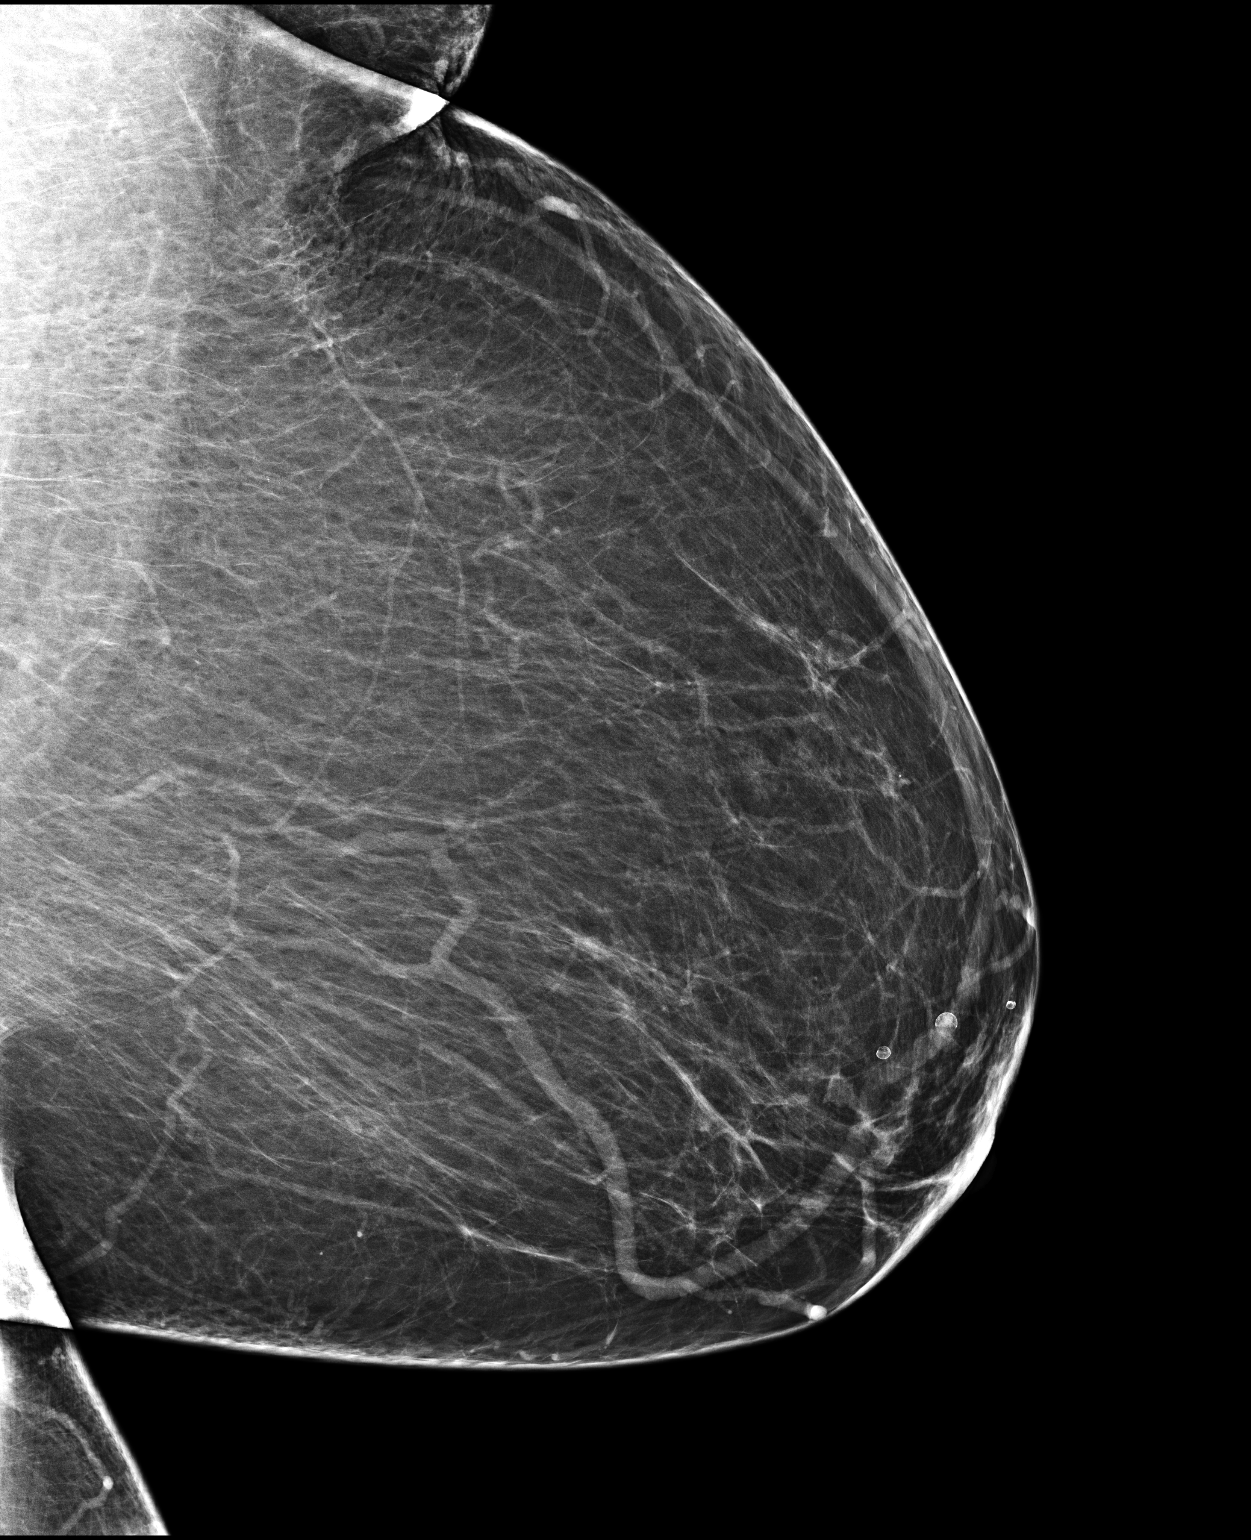

[2 of 2 positions shown; findings below may reference images not displayed]

ACR Breast Density Category b: There are scattered areas of
fibroglandular density.
FINDINGS: The patient has had a right mastectomy. There are no findings
suspicious for malignancy.

Images were processed with CAD.
IMPRESSION: No mammographic evidence of malignancy. A result letter of this
screening mammogram will be mailed directly to the patient.

RECOMMENDATION:
Screening mammogram in one year.  (Code:[id number])

BI-RADS CATEGORY  1: Negative.

## 2017-09-02 ENCOUNTER — Inpatient Hospital Stay: Payer: Medicare Other | Attending: Internal Medicine

## 2017-09-02 DIAGNOSIS — C50812 Malignant neoplasm of overlapping sites of left female breast: Secondary | ICD-10-CM | POA: Diagnosis present

## 2017-09-02 DIAGNOSIS — Z170 Estrogen receptor positive status [ER+]: Secondary | ICD-10-CM | POA: Diagnosis not present

## 2017-09-02 DIAGNOSIS — Z79811 Long term (current) use of aromatase inhibitors: Secondary | ICD-10-CM | POA: Insufficient documentation

## 2017-09-02 DIAGNOSIS — Z9221 Personal history of antineoplastic chemotherapy: Secondary | ICD-10-CM | POA: Insufficient documentation

## 2017-09-02 DIAGNOSIS — Z95828 Presence of other vascular implants and grafts: Secondary | ICD-10-CM

## 2017-09-02 DIAGNOSIS — Z452 Encounter for adjustment and management of vascular access device: Secondary | ICD-10-CM | POA: Insufficient documentation

## 2017-09-02 MED ORDER — SODIUM CHLORIDE 0.9% FLUSH
10.0000 mL | INTRAVENOUS | Status: AC | PRN
  Administered 2017-09-02: 10 mL
  Filled 2017-09-02: qty 10

## 2017-09-02 MED ORDER — HEPARIN SOD (PORK) LOCK FLUSH 100 UNIT/ML IV SOLN
500.0000 [IU] | INTRAVENOUS | Status: AC | PRN
  Administered 2017-09-02: 500 [IU]

## 2017-09-05 ENCOUNTER — Other Ambulatory Visit: Payer: Self-pay | Admitting: Internal Medicine

## 2017-09-05 DIAGNOSIS — Z1231 Encounter for screening mammogram for malignant neoplasm of breast: Secondary | ICD-10-CM

## 2017-10-03 ENCOUNTER — Ambulatory Visit
Admission: RE | Admit: 2017-10-03 | Discharge: 2017-10-03 | Disposition: A | Discharge status: Home / Self Care | Payer: Medicare Other | Source: Ambulatory Visit | Attending: Internal Medicine | Admitting: Internal Medicine

## 2017-10-03 DIAGNOSIS — Z1231 Encounter for screening mammogram for malignant neoplasm of breast: Secondary | ICD-10-CM | POA: Insufficient documentation

## 2017-10-10 ENCOUNTER — Inpatient Hospital Stay: Payer: Medicare Other | Attending: Internal Medicine

## 2017-10-19 ENCOUNTER — Inpatient Hospital Stay: Payer: Medicare Other | Attending: Internal Medicine

## 2017-11-21 ENCOUNTER — Inpatient Hospital Stay: Payer: Medicare Other

## 2017-11-30 ENCOUNTER — Inpatient Hospital Stay: Payer: Medicare Other | Attending: Internal Medicine

## 2017-11-30 DIAGNOSIS — C50911 Malignant neoplasm of unspecified site of right female breast: Secondary | ICD-10-CM | POA: Diagnosis not present

## 2017-11-30 DIAGNOSIS — Z452 Encounter for adjustment and management of vascular access device: Secondary | ICD-10-CM | POA: Insufficient documentation

## 2017-11-30 DIAGNOSIS — Z95828 Presence of other vascular implants and grafts: Secondary | ICD-10-CM

## 2017-11-30 MED ORDER — HEPARIN SOD (PORK) LOCK FLUSH 100 UNIT/ML IV SOLN
500.0000 [IU] | INTRAVENOUS | Status: AC | PRN
  Administered 2017-11-30: 500 [IU]

## 2017-11-30 MED ORDER — SODIUM CHLORIDE 0.9% FLUSH
10.0000 mL | INTRAVENOUS | Status: AC | PRN
  Administered 2017-11-30: 10 mL
  Filled 2017-11-30: qty 10

## 2018-01-16 ENCOUNTER — Inpatient Hospital Stay: Payer: Medicare Other | Attending: Internal Medicine

## 2018-01-16 ENCOUNTER — Other Ambulatory Visit: Payer: Self-pay

## 2018-01-16 ENCOUNTER — Inpatient Hospital Stay (HOSPITAL_BASED_OUTPATIENT_CLINIC_OR_DEPARTMENT_OTHER): Payer: Medicare Other | Admitting: Internal Medicine

## 2018-01-16 ENCOUNTER — Other Ambulatory Visit: Payer: Self-pay | Admitting: Internal Medicine

## 2018-01-16 VITALS — BP 129/77 | HR 91 | Temp 97.9°F | Resp 16 | Wt 206.8 lb

## 2018-01-16 DIAGNOSIS — Z170 Estrogen receptor positive status [ER+]: Principal | ICD-10-CM

## 2018-01-16 DIAGNOSIS — E1165 Type 2 diabetes mellitus with hyperglycemia: Secondary | ICD-10-CM

## 2018-01-16 DIAGNOSIS — C50812 Malignant neoplasm of overlapping sites of left female breast: Secondary | ICD-10-CM

## 2018-01-16 DIAGNOSIS — R5383 Other fatigue: Secondary | ICD-10-CM

## 2018-01-16 DIAGNOSIS — Z79811 Long term (current) use of aromatase inhibitors: Secondary | ICD-10-CM | POA: Diagnosis not present

## 2018-01-16 LAB — CBC WITH DIFFERENTIAL/PLATELET
Basophils Absolute: 0 10*3/uL (ref 0–0.1)
Basophils Relative: 1 %
EOS ABS: 0.1 10*3/uL (ref 0–0.7)
EOS PCT: 2 %
HEMATOCRIT: 36.4 % (ref 35.0–47.0)
Hemoglobin: 11.3 g/dL — ABNORMAL LOW (ref 12.0–16.0)
Lymphocytes Relative: 42 %
Lymphs Abs: 2.4 10*3/uL (ref 1.0–3.6)
MCH: 21.2 pg — ABNORMAL LOW (ref 26.0–34.0)
MCHC: 31 g/dL — AB (ref 32.0–36.0)
MCV: 68.4 fL — ABNORMAL LOW (ref 80.0–100.0)
MONO ABS: 0.3 10*3/uL (ref 0.2–0.9)
MONOS PCT: 6 %
Neutro Abs: 2.9 10*3/uL (ref 1.4–6.5)
Neutrophils Relative %: 49 %
PLATELETS: 334 10*3/uL (ref 150–440)
RBC: 5.32 MIL/uL — ABNORMAL HIGH (ref 3.80–5.20)
RDW: 14.9 % — AB (ref 11.5–14.5)
WBC: 5.8 10*3/uL (ref 3.6–11.0)

## 2018-01-16 LAB — COMPREHENSIVE METABOLIC PANEL
ALT: 32 U/L (ref 14–54)
ANION GAP: 12 (ref 5–15)
AST: 23 U/L (ref 15–41)
Albumin: 4 g/dL (ref 3.5–5.0)
Alkaline Phosphatase: 95 U/L (ref 38–126)
BILIRUBIN TOTAL: 0.9 mg/dL (ref 0.3–1.2)
BUN: 23 mg/dL — ABNORMAL HIGH (ref 6–20)
CHLORIDE: 103 mmol/L (ref 101–111)
CO2: 25 mmol/L (ref 22–32)
Calcium: 10.1 mg/dL (ref 8.9–10.3)
Creatinine, Ser: 0.95 mg/dL (ref 0.44–1.00)
GFR calc Af Amer: >60 mL/min (ref >60)
GFR calc non Af Amer: 60 mL/min — ABNORMAL LOW (ref >60)
GLUCOSE: 202 mg/dL — AB (ref 65–99)
POTASSIUM: 4.6 mmol/L (ref 3.5–5.1)
Sodium: 140 mmol/L (ref 135–145)
TOTAL PROTEIN: 7.2 g/dL (ref 6.5–8.1)

## 2018-01-16 NOTE — Assessment & Plan Note (Addendum)
#  RIGHT BREAST IPISILATERAL RECURRENCE- STAGE I Breast cancer ER/PR positive HER-2/neu negative- currently letrozole [since April 2014].    # Clinically no evidence of recurrence. Continue letrozole for now- extended duration x10 years [as patient received chemotherapy in the past]   # Hot flashes- chronic- from aromatase inhibitor; patient not interested in pharmacotherapy.  # Diabetes- poorly controlled. Blood sugar 202; likely cause of her fatigue. Recommend follow up with PCP.   #Mediport-discussed with the patient that patient has a small overall risk of recurrence ; I think it is reasonable to explant the port.  # microcytic anemia- ? Congential causes- Iron studies/ ferritin sep 2017- NOT iron deficient. 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 Hb electrophoresisNOmral.   # BMD- normal [2017]- wnl.   # port flush every 2 months/ refrt Dr.Diaz re: port explanation.   # follow up in 6 months/labs.   Cc: Dr.Singh.

## 2018-01-16 NOTE — Progress Notes (Signed)
[name] OFFICE PROGRESS NOTE  Patient Care Team: Glendon Axe, MD as PCP - General (Internal Medicine)  Cancer Staging No matching staging information was found for the patient.   Oncology History   # 1996- Right breast. 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, [name], [name], [name]; [name], 2014) 7. Patient was started on Cytoxan and Taxotere January 11, 2013; Patient is on letrozole from April of 2014  # NOV 2016- BMD- Normal.   3. BRCA negative (verbal information from [place])        Carcinoma of overlapping sites of left breast in female, estrogen receptor positive ([place])     INTERVAL HISTORY:  Nancy Gaines 72 y.o.  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  Patient continues to complain of fatigue.  Otherwise denies any bone pain.  She has been exercising through [facility] care program.  Patient denies any weight loss.  She denies any new lumps or bumps.  Denies any unusual shortness of breath or chest pain.  States her blood sugars not well controlled.  REVIEW OF SYSTEMS:  A complete 10 point review of system is done which is negative except  mentioned above/history of present illness.   PAST MEDICAL HISTORY :  Past Medical History:  Diagnosis Date  . Anemia   . Breast cancer ([place]) 2014   breast- chemo  . Cancer [facility]) 1996   breast cancer- radiation  . Degenerative joint disease   . Dermatophytosis of nail   . Diabetes mellitus without complication ([place])   . Diverticulosis   . Hypercholesterolemia   . Hypertension   . Personal history of chemotherapy 2014   BREAST CA  . Personal history of radiation therapy 1996   BREAST CA    PAST SURGICAL HISTORY :   Past Surgical History:  Procedure Laterality Date  . ABDOMINAL HYSTERECTOMY    . COLONOSCOPY WITH PROPOFOL N/A 08/29/2015   Procedure: COLONOSCOPY WITH PROPOFOL;  [name], MD;  Location: [facility] ENDOSCOPY;  Service: Gastroenterology;  [name];  . DILATION AND CURETTAGE, DIAGNOSTIC / THERAPEUTIC    . MASTECTOMY Right 1996   BREAST CA    FAMILY HISTORY :   Family History  Problem Relation Age of Onset  . Breast cancer Maternal Aunt   . Breast cancer Paternal Aunt   . Breast cancer Sister 73    SOCIAL HISTORY:   Social History   Tobacco Use  . Smoking status: Never Smoker  . Smokeless tobacco: Never Used  Substance Use Topics  . Alcohol use: No  . Drug use: No    ALLERGIES:  is allergic to lipitor [atorvastatin].  MEDICATIONS:  Current Outpatient Medications  Medication Sig Dispense Refill  .  aspirin 81 MG tablet Take 81 mg by mouth daily.    . Calcium Carb-Cholecalciferol (CALCIUM 600+D3) 600-800 MG-UNIT TABS Take by mouth.    . Cholecalciferol (VITAMIN D3) 1000 UNITS CAPS Take by mouth.    . ferrous sulfate 325 (65 FE) MG tablet Take 325 mg by mouth daily with breakfast.     . glimepiride (AMARYL) 4 MG tablet TAKE ONE TABLET BY MOUTH TWICE DAILY WITH A MEAL    . glucose blood (BAYER CONTOUR NEXT TEST) test strip Use once daily. Use as instructed.    Marland Kitchen letrozole (FEMARA) 2.5 MG tablet TAKE 1 TABLET BY MOUTH ONCE DAILY 90 tablet 1   . lidocaine-prilocaine (EMLA) cream Apply 1 application topically as needed. 30 min prior to accessing port 30 g 3  . losartan-hydrochlorothiazide (HYZAAR) 100-12.5 MG tablet Take 1 tablet by mouth daily.    . metFORMIN (GLUCOPHAGE) 1000 MG tablet Take 1,000 mg by mouth 2 (two) times daily with a meal.     . Multiple Vitamin (MULTI-VITAMINS) TABS Take by mouth.    . pravastatin (PRAVACHOL) 10 MG tablet Take 10 mg by mouth daily.     No current facility-administered medications for this visit.    Facility-Administered Medications Ordered in Other Visits  Medication Dose Route Frequency Provider Last Rate Last Dose  . heparin lock flush 100 unit/mL  500 Units Intravenous Once Charlaine Dalton R, MD      . heparin lock flush 100 unit/mL  500 Units Intravenous Once Charlaine Dalton R, MD      . sodium chloride flush (NS) 0.9 % injection 10 mL  10 mL Intravenous Once Charlaine Dalton R, MD      . sodium chloride flush (NS) 0.9 % injection 10 mL  10 mL Intravenous Once Charlaine Dalton R, MD      . sodium chloride flush (NS) 0.9 % injection 10 mL  10 mL Intravenous PRN Cammie Sickle, MD        PHYSICAL EXAMINATION: ECOG PERFORMANCE STATUS: 0 - Asymptomatic  BP 129/77 (BP Location: Left Arm, Patient Position: Sitting)   Pulse 91   Temp 97.9 F (36.6 C) (Tympanic)   Resp 16   Wt 206 lb 12.8 oz (93.8 kg)   BMI 37.82 kg/m   Filed Weights   01/16/18 1115  Weight: 206 lb 12.8 oz (93.8 kg)    GENERAL: Well-nourished well-developed; Alert, no distress and comfortable.   Alone. EYES: no pallor or icterus OROPHARYNX: no thrush or ulceration; good dentition  NECK: supple, no masses felt LYMPH:  no palpable lymphadenopathy in the cervical, axillary or inguinal regions LUNGS: clear to auscultation and  No wheeze or crackles HEART/CVS: regular rate & rhythm and no murmurs; No lower extremity edema ABDOMEN:abdomen soft, non-tender and normal bowel sounds Musculoskeletal:no  cyanosis of digits and no clubbing  PSYCH: alert & oriented x 3 with fluent speech NEURO: no focal motor/sensory deficits SKIN:  no rashes or significant lesions Right and left BREAST exam [in the presence of nurse]- no unusual skin changes or dominant masses felt. Surgical scars noted. Right mastectomy noted.  LABORATORY DATA:  I have reviewed the data as listed    Component Value Date/Time   NA 140 01/16/2018 1108   NA 141 05/06/2014 1039   K 4.6 01/16/2018 1108   K 4.4 05/06/2014 1039   CL 103 01/16/2018 1108   CL 104 05/06/2014 1039   CO2 25 01/16/2018 1108   CO2 30 05/06/2014 1039   GLUCOSE 202 (  H) 01/16/2018 1108   GLUCOSE 184 (H) 05/06/2014 1039   BUN 23 (H) 01/16/2018 1108   BUN 21 (H) 05/06/2014 1039   CREATININE 0.95 01/16/2018 1108   CREATININE 1.04 05/06/2014 1039   CALCIUM 10.1 01/16/2018 1108   CALCIUM 9.7 05/06/2014 1039   PROT 7.2 01/16/2018 1108   PROT 7.3 05/06/2014 1039   ALBUMIN 4.0 01/16/2018 1108   ALBUMIN 3.7 05/06/2014 1039   AST 23 01/16/2018 1108   AST 21 05/06/2014 1039   ALT 32 01/16/2018 1108   ALT 63 05/06/2014 1039   ALKPHOS 95 01/16/2018 1108   ALKPHOS 102 05/06/2014 1039   BILITOT 0.9 01/16/2018 1108   BILITOT 0.7 05/06/2014 1039   GFRNONAA 60 (L) 01/16/2018 1108   GFRNONAA 56 (L) 05/06/2014 1039   GFRAA >60 01/16/2018 1108   GFRAA >60 05/06/2014 1039    No results found for: SPEP, UPEP  Lab Results  Component Value Date   WBC 5.8 01/16/2018   NEUTROABS 2.9 01/16/2018   HGB 11.3 (L) 01/16/2018   HCT 36.4 01/16/2018   MCV 68.4 (L) 01/16/2018   PLT 334 01/16/2018      Chemistry      Component Value Date/Time   NA 140 01/16/2018 1108   NA 141 05/06/2014 1039   K 4.6 01/16/2018 1108   K 4.4 05/06/2014 1039   CL 103 01/16/2018 1108   CL 104 05/06/2014 1039   CO2 25 01/16/2018 1108   CO2 30 05/06/2014 1039   BUN 23 (H) 01/16/2018 1108   BUN 21 (H) 05/06/2014 1039   CREATININE 0.95 01/16/2018 1108   CREATININE 1.04  05/06/2014 1039      Component Value Date/Time   CALCIUM 10.1 01/16/2018 1108   CALCIUM 9.7 05/06/2014 1039   ALKPHOS 95 01/16/2018 1108   ALKPHOS 102 05/06/2014 1039   AST 23 01/16/2018 1108   AST 21 05/06/2014 1039   ALT 32 01/16/2018 1108   ALT 63 05/06/2014 1039   BILITOT 0.9 01/16/2018 1108   BILITOT 0.7 05/06/2014 1039     IMPRESSION: No mammographic evidence of malignancy. A result letter of this screening mammogram will be mailed directly to the patient.  RECOMMENDATION: Screening mammogram in one year.  (Code:SM-L-26M)  BI-RADS CATEGORY  1: Negative.   Electronically Signed   By: Pamelia Hoit M.D.   On: 09/29/2016 17:42  RADIOGRAPHIC STUDIES: I have personally reviewed the radiological images as listed and agreed with the findings in the report. No results found.   ASSESSMENT & PLAN:  Carcinoma of overlapping sites of left breast in female, estrogen receptor positive ([place]) # RIGHT BREAST IPISILATERAL RECURRENCE- STAGE I Breast cancer ER/PR positive HER-2/neu negative- currently letrozole [since April 2014].    # Clinically no evidence of recurrence. Continue letrozole for now- extended duration x10 years [as patient received chemotherapy in the past]   # Hot flashes- chronic- from aromatase inhibitor; patient not interested in pharmacotherapy.  # Diabetes- poorly controlled. Blood sugar 202; likely cause of her fatigue. Recommend follow up with PCP.   #Mediport-discussed with the patient that patient has a small overall risk of recurrence ; I think it is reasonable to explant the port.  # microcytic anemia- ? Congential causes- Iron studies/ ferritin sep 2017- NOT iron deficient. 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 Hb electrophoresisNOmral.   # BMD- normal [2017]- wnl.   # port flush every 2 months/ refrt Dr.Diaz re: port explanation.   # follow up in 6 months/labs.   Cc: Dr.Singh.  No orders of the defined types were placed in this encounter.  All questions  were answered. The patient knows to call the clinic with any problems, questions or concerns.      Cammie Sickle, MD 01/16/2018 6:11 PM

## 2018-02-13 ENCOUNTER — Other Ambulatory Visit: Payer: Self-pay | Admitting: Internal Medicine

## 2018-02-13 DIAGNOSIS — Z95828 Presence of other vascular implants and grafts: Secondary | ICD-10-CM

## 2018-02-20 ENCOUNTER — Telehealth: Payer: Self-pay | Admitting: *Deleted

## 2018-02-20 NOTE — Telephone Encounter (Signed)
Patient called and states she contacted [facility] Surgery Department and was told that we have not sent order for her port to be removed. She requests we do this ASAP so that she can schedule her appointment to have it removed. (I see a referral in computer, but this does not go to [facility])

## 2018-02-20 NOTE — Telephone Encounter (Signed)
Port removal order- faxed to [facility] Surgery Dept

## 2018-02-20 NOTE — Telephone Encounter (Signed)
Phone call to patient to inform that order was faxed

## 2018-06-20 ENCOUNTER — Other Ambulatory Visit: Payer: Self-pay | Admitting: *Deleted

## 2018-06-20 MED ORDER — LETROZOLE 2.5 MG PO TABS: 2.5000 mg | ORAL_TABLET | Freq: Every day | ORAL | 1 refills | Status: DC

## 2018-07-17 ENCOUNTER — Inpatient Hospital Stay (HOSPITAL_BASED_OUTPATIENT_CLINIC_OR_DEPARTMENT_OTHER): Payer: Medicare Other | Admitting: Internal Medicine

## 2018-07-17 ENCOUNTER — Other Ambulatory Visit: Payer: Self-pay

## 2018-07-17 ENCOUNTER — Inpatient Hospital Stay: Payer: Medicare Other | Attending: Internal Medicine

## 2018-07-17 ENCOUNTER — Other Ambulatory Visit: Payer: Self-pay | Admitting: *Deleted

## 2018-07-17 VITALS — BP 146/76 | HR 72 | Temp 97.6°F | Resp 20 | Ht 62.0 in | Wt 206.0 lb

## 2018-07-17 DIAGNOSIS — E119 Type 2 diabetes mellitus without complications: Secondary | ICD-10-CM | POA: Insufficient documentation

## 2018-07-17 DIAGNOSIS — Z7982 Long term (current) use of aspirin: Secondary | ICD-10-CM

## 2018-07-17 DIAGNOSIS — C50812 Malignant neoplasm of overlapping sites of left female breast: Secondary | ICD-10-CM | POA: Insufficient documentation

## 2018-07-17 DIAGNOSIS — I10 Essential (primary) hypertension: Secondary | ICD-10-CM | POA: Diagnosis not present

## 2018-07-17 DIAGNOSIS — Z170 Estrogen receptor positive status [ER+]: Secondary | ICD-10-CM | POA: Insufficient documentation

## 2018-07-17 DIAGNOSIS — Z9011 Acquired absence of right breast and nipple: Secondary | ICD-10-CM

## 2018-07-17 DIAGNOSIS — D509 Iron deficiency anemia, unspecified: Secondary | ICD-10-CM | POA: Insufficient documentation

## 2018-07-17 LAB — CBC WITH DIFFERENTIAL/PLATELET
BASOS ABS: 0 10*3/uL (ref 0–0.1)
BASOS PCT: 1 %
Eosinophils Absolute: 0.1 10*3/uL (ref 0–0.7)
Eosinophils Relative: 2 %
HEMATOCRIT: 35.7 % (ref 35.0–47.0)
Hemoglobin: 11 g/dL — ABNORMAL LOW (ref 12.0–16.0)
LYMPHS PCT: 46 %
Lymphs Abs: 2.5 10*3/uL (ref 1.0–3.6)
MCH: 20.9 pg — ABNORMAL LOW (ref 26.0–34.0)
MCHC: 30.8 g/dL — ABNORMAL LOW (ref 32.0–36.0)
MCV: 67.9 fL — AB (ref 80.0–100.0)
Monocytes Absolute: 0.4 10*3/uL (ref 0.2–0.9)
Monocytes Relative: 7 %
NEUTROS ABS: 2.4 10*3/uL (ref 1.4–6.5)
NEUTROS PCT: 44 %
PLATELETS: 339 10*3/uL (ref 150–440)
RBC: 5.25 MIL/uL — AB (ref 3.80–5.20)
RDW: 15 % — AB (ref 11.5–14.5)
WBC: 5.4 10*3/uL (ref 3.6–11.0)

## 2018-07-17 LAB — COMPREHENSIVE METABOLIC PANEL
ALBUMIN: 4 g/dL (ref 3.5–5.0)
ALT: 27 U/L (ref 0–44)
AST: 20 U/L (ref 15–41)
Alkaline Phosphatase: 82 U/L (ref 38–126)
Anion gap: 8 (ref 5–15)
BILIRUBIN TOTAL: 0.8 mg/dL (ref 0.3–1.2)
BUN: 23 mg/dL (ref 8–23)
CHLORIDE: 106 mmol/L (ref 98–111)
CO2: 27 mmol/L (ref 22–32)
CREATININE: 0.95 mg/dL (ref 0.44–1.00)
Calcium: 9.7 mg/dL (ref 8.9–10.3)
GFR calc Af Amer: >60 mL/min (ref >60)
GFR, EST NON AFRICAN AMERICAN: 59 mL/min — AB (ref >60)
Glucose, Bld: 130 mg/dL — ABNORMAL HIGH (ref 70–99)
POTASSIUM: 4.6 mmol/L (ref 3.5–5.1)
Sodium: 141 mmol/L (ref 135–145)
TOTAL PROTEIN: 6.7 g/dL (ref 6.5–8.1)

## 2018-07-17 MED ORDER — LETROZOLE 2.5 MG PO TABS: 2.5000 mg | ORAL_TABLET | Freq: Every day | ORAL | 3 refills | Status: DC

## 2018-07-17 NOTE — Progress Notes (Signed)
[place] OFFICE PROGRESS NOTE  Patient Care Team: Glendon Axe, [name], MD as Consulting Physician (General Surgery)  Cancer Staging No matching staging information was found for the patient.   Oncology History   # 1996- Right breast. 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, [name], [name], [name]; [name], 2014) 7. Patient was started on Cytoxan and Taxotere January 11, 2013; Patient is on letrozole from April of 2014  # NOV 2016- BMD- Normal.   3. BRCA negative (verbal information from [place])        Carcinoma of overlapping sites of left breast in female, estrogen receptor positive ([place])     INTERVAL HISTORY:  Nancy Gaines 72 y.o.  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  Patient states that she has been exercising almost daily basis.  She has lost some weight.   She states her hemoglobin A1c is improved from 10 to around 7 at this time.  Denies any new lumps or bumps.  Denies any hot flashes.  Review of Systems  Constitutional: Negative for chills, diaphoresis, fever,  malaise/fatigue and weight loss.  HENT: Negative for nosebleeds and sore throat.   Eyes: Negative for double vision.  Respiratory: Negative for cough, hemoptysis, sputum production, shortness of breath and wheezing.   Cardiovascular: Negative for chest pain, palpitations, orthopnea and leg swelling.  Gastrointestinal: Negative for abdominal pain, blood in stool, constipation, diarrhea, heartburn, melena, nausea and vomiting.  Genitourinary: Negative for dysuria, frequency and urgency.  Musculoskeletal: Negative for back pain and joint pain.  Skin: Negative.  Negative for itching and rash.  Neurological: Negative for dizziness, tingling, focal weakness, weakness and headaches.  Endo/Heme/Allergies: Does not bruise/bleed easily.  Psychiatric/Behavioral: Negative for depression. The patient is not nervous/anxious and does not have insomnia.      PAST MEDICAL HISTORY :  Past Medical History:  Diagnosis Date  . Anemia   . Breast cancer ([place]) 2014   breast- chemo  . Cancer [facility]) 1996   breast cancer- radiation  . Degenerative joint disease   . Dermatophytosis of nail   . Diabetes mellitus without complication ([place])   . Diverticulosis   . Hypercholesterolemia   . Hypertension   . Personal history of chemotherapy 2014   BREAST CA  . Personal history of radiation therapy 1996   BREAST CA    PAST SURGICAL HISTORY :   Past Surgical History:  Procedure Laterality Date  . ABDOMINAL HYSTERECTOMY    . COLONOSCOPY WITH PROPOFOL N/A 08/29/2015   Procedure: COLONOSCOPY WITH PROPOFOL;  [name], MD;  Location[facility] ENDOSCOPY;  Service: Gastroenterology;  [name];  . DILATION AND CURETTAGE, DIAGNOSTIC / THERAPEUTIC    .  MASTECTOMY Right 1996   BREAST CA    FAMILY HISTORY :   Family History  Problem Relation Age of Onset  . Breast cancer Maternal Aunt   . Breast cancer Paternal Aunt   . Breast cancer Sister 38    SOCIAL HISTORY:   Social History   Tobacco Use  .  Smoking status: Never Smoker  . Smokeless tobacco: Never Used  Substance Use Topics  . Alcohol use: No  . Drug use: No    ALLERGIES:  is allergic to lipitor [atorvastatin].  MEDICATIONS:  Current Outpatient Medications  Medication Sig Dispense Refill  . aspirin 81 MG tablet Take 81 mg by mouth daily.    . Calcium Carb-Cholecalciferol (CALCIUM 600+D3) 600-800 MG-UNIT TABS Take by mouth.    . Cholecalciferol (VITAMIN D3) 1000 UNITS CAPS Take by mouth.    . ferrous sulfate 325 (65 FE) MG tablet Take 325 mg by mouth daily with breakfast.     . glimepiride (AMARYL) 4 MG tablet TAKE ONE TABLET BY MOUTH TWICE DAILY WITH A MEAL    . glucose blood (BAYER CONTOUR NEXT TEST) test strip Use once daily. Use as instructed.    Marland Kitchen letrozole (FEMARA) 2.5 MG tablet Take 1 tablet (2.5 mg total) by mouth daily. 90 tablet 3  . losartan-hydrochlorothiazide (HYZAAR) 100-12.5 MG tablet Take 1 tablet by mouth daily.    . metFORMIN (GLUCOPHAGE) 1000 MG tablet Take 1,000 mg by mouth 2 (two) times daily with a meal.     . Multiple Vitamin (MULTI-VITAMINS) TABS Take by mouth.    . pravastatin (PRAVACHOL) 10 MG tablet Take 10 mg by mouth daily.     No current facility-administered medications for this visit.    Facility-Administered Medications Ordered in Other Visits  Medication Dose Route Frequency Provider Last Rate Last Dose  . heparin lock flush 100 unit/mL  500 Units Intravenous Once Charlaine Dalton R, MD      . heparin lock flush 100 unit/mL  500 Units Intravenous Once Charlaine Dalton R, MD      . sodium chloride flush (NS) 0.9 % injection 10 mL  10 mL Intravenous Once Charlaine Dalton R, MD      . sodium chloride flush (NS) 0.9 % injection 10 mL  10 mL Intravenous Once Charlaine Dalton R, MD      . sodium chloride flush (NS) 0.9 % injection 10 mL  10 mL Intravenous PRN Cammie Sickle, MD        PHYSICAL EXAMINATION: ECOG PERFORMANCE STATUS: 0 - Asymptomatic  There were no vitals  taken for this visit.  There were no vitals filed for this visit.  Physical Exam  Constitutional: She is oriented to person, place, and time and well-developed, well-nourished, and in no distress.  HENT:  Head: Normocephalic and atraumatic.  Mouth/Throat: Oropharynx is clear and moist. No oropharyngeal exudate.  Eyes: Pupils are equal, round, and reactive to light.  Neck: Normal range of motion. Neck supple.  Cardiovascular: Normal rate and regular rhythm.  Pulmonary/Chest: No respiratory distress. She has no wheezes.  Abdominal: Soft. Bowel sounds are normal. She exhibits no distension and no mass. There is no tenderness. There is no rebound and no guarding.  Musculoskeletal: Normal range of motion. She exhibits no edema or tenderness.  Neurological: She is alert and oriented to person, place, and time.  Skin: Skin is warm.  Right and left BREAST exam (in the presence of nurse)- no unusual skin changes or dominant masses felt. Surgical scars  noted. Right mastectomy noted  Psychiatric: Affect normal.    .  LABORATORY DATA:  I have reviewed the data as listed    Component Value Date/Time   NA 141 07/17/2018 1104   NA 141 05/06/2014 1039   K 4.6 07/17/2018 1104   K 4.4 05/06/2014 1039   CL 106 07/17/2018 1104   CL 104 05/06/2014 1039   CO2 27 07/17/2018 1104   CO2 30 05/06/2014 1039   GLUCOSE 130 (H) 07/17/2018 1104   GLUCOSE 184 (H) 05/06/2014 1039   BUN 23 07/17/2018 1104   BUN 21 (H) 05/06/2014 1039   CREATININE 0.95 07/17/2018 1104   CREATININE 1.04 05/06/2014 1039   CALCIUM 9.7 07/17/2018 1104   CALCIUM 9.7 05/06/2014 1039   PROT 6.7 07/17/2018 1104   PROT 7.3 05/06/2014 1039   ALBUMIN 4.0 07/17/2018 1104   ALBUMIN 3.7 05/06/2014 1039   AST 20 07/17/2018 1104   AST 21 05/06/2014 1039   ALT 27 07/17/2018 1104   ALT 63 05/06/2014 1039   ALKPHOS 82 07/17/2018 1104   ALKPHOS 102 05/06/2014 1039   BILITOT 0.8 07/17/2018 1104   BILITOT 0.7 05/06/2014 1039    GFRNONAA 59 (L) 07/17/2018 1104   GFRNONAA 56 (L) 05/06/2014 1039   GFRAA >60 07/17/2018 1104   GFRAA >60 05/06/2014 1039    No results found for: SPEP, UPEP  Lab Results  Component Value Date   WBC 5.4 07/17/2018   NEUTROABS 2.4 07/17/2018   HGB 11.0 (L) 07/17/2018   HCT 35.7 07/17/2018   MCV 67.9 (L) 07/17/2018   PLT 339 07/17/2018      Chemistry      Component Value Date/Time   NA 141 07/17/2018 1104   NA 141 05/06/2014 1039   K 4.6 07/17/2018 1104   K 4.4 05/06/2014 1039   CL 106 07/17/2018 1104   CL 104 05/06/2014 1039   CO2 27 07/17/2018 1104   CO2 30 05/06/2014 1039   BUN 23 07/17/2018 1104   BUN 21 (H) 05/06/2014 1039   CREATININE 0.95 07/17/2018 1104   CREATININE 1.04 05/06/2014 1039      Component Value Date/Time   CALCIUM 9.7 07/17/2018 1104   CALCIUM 9.7 05/06/2014 1039   ALKPHOS 82 07/17/2018 1104   ALKPHOS 102 05/06/2014 1039   AST 20 07/17/2018 1104   AST 21 05/06/2014 1039   ALT 27 07/17/2018 1104   ALT 63 05/06/2014 1039   BILITOT 0.8 07/17/2018 1104   BILITOT 0.7 05/06/2014 1039      RADIOGRAPHIC STUDIES: I have personally reviewed the radiological images as listed and agreed with the findings in the report. No results found.   ASSESSMENT & PLAN:  Carcinoma of overlapping sites of left breast in female, estrogen receptor positive ([place]) # RIGHT BREAST IPISILATERAL RECURRENCE- STAGE I Breast cancer ER/PR positive HER-2/neu negative- currently letrozole [since April 2014].    # Clinically no evidence of recurrence. Continue letrozole for now- extended  [until 2014 spring].  Stable.  #Hot flashes grade 1 stable.  #Diabetes hemoglobin A1c improved at 7.   #Microcytic anemia- globin 11/suspect thalassemia.  Asymptomatic.  # BMD- normal [Nov 2017]; we plan again in 1 year.   # follow up in 6 months/labs; mammogram [pcp; dec 2019]  Cc: Dr.Singh.    No orders of the defined types were placed in this encounter.  All questions were  answered. The patient knows to call the clinic with any problems, questions or concerns.  Cammie Sickle, MD 07/17/2018 12:16 PM

## 2018-07-17 NOTE — Assessment & Plan Note (Addendum)
#  RIGHT BREAST IPISILATERAL RECURRENCE- STAGE I Breast cancer ER/PR positive HER-2/neu negative- currently letrozole [since April 2014].    # Clinically no evidence of recurrence. Continue letrozole for now- extended  [until 2014 spring].  Stable.  #Hot flashes grade 1 stable.  #Diabetes hemoglobin A1c improved at 7.   #Microcytic anemia- globin 11/suspect thalassemia.  Asymptomatic.  # BMD- normal [Nov 2017]; we plan again in 1 year.   # follow up in 6 months/labs; mammogram [pcp; dec 2019]  Cc: Dr.Singh.

## 2018-09-13 ENCOUNTER — Other Ambulatory Visit: Payer: Self-pay | Admitting: Internal Medicine

## 2018-09-13 DIAGNOSIS — Z1231 Encounter for screening mammogram for malignant neoplasm of breast: Secondary | ICD-10-CM

## 2018-10-12 ENCOUNTER — Ambulatory Visit
Admission: RE | Admit: 2018-10-12 | Discharge: 2018-10-12 | Disposition: A | Discharge status: Home / Self Care | Payer: Medicare Other | Source: Ambulatory Visit | Attending: Internal Medicine | Admitting: Internal Medicine

## 2018-10-12 DIAGNOSIS — Z1231 Encounter for screening mammogram for malignant neoplasm of breast: Secondary | ICD-10-CM | POA: Diagnosis not present

## 2018-11-15 ENCOUNTER — Telehealth: Payer: Self-pay

## 2018-11-15 NOTE — Telephone Encounter (Signed)
Patient came into office stating that all [facility] have Letrozole on back order due to a issue with the production company. Patient states that she has 2 days left of her Letrozole 2.5 mg tablet, and she wants to know if there are any other options for her to take? Please advise. Thanks.

## 2018-11-16 MED ORDER — ANASTROZOLE 1 MG PO TABS: 1.0000 mg | ORAL_TABLET | Freq: Every day | ORAL | 4 refills | Status: DC

## 2018-11-16 NOTE — Addendum Note (Signed)
Addended by: Sabino Gasser on: 11/16/2018 08:48 AM   Modules accepted: Orders

## 2018-11-16 NOTE — Telephone Encounter (Signed)
Spoke with Dr. Rogue Bussing - v/o to send new rx for arimidex 1 mg tablet orally daily. Patient made aware that new rx was sent to her [facility]. She stated that she received a notification from [facility] that a script was ready to be picked up but she "didn't know if this was her diabetes medications or the letrozole." "They told me that the manufacture previously had the letrozole on backorder." I told the patient that if she was unable to get the letrozole that Dr. B would like her to start on Arimidex 1 mg (an alternative drug to treat her breast cancer). Patient will contact me back to let me know if she picked up the letrozole today or arimidex. She thanked me for updating her on the plan of care.

## 2018-11-17 ENCOUNTER — Other Ambulatory Visit: Payer: Self-pay | Admitting: *Deleted

## 2018-11-17 NOTE — Telephone Encounter (Signed)
Patient contacted RN - patient was able to get get the letrozole in and she is resuming this rx. Script for armidex cnl on patient's chart.

## 2018-12-08 ENCOUNTER — Telehealth: Payer: Self-pay | Admitting: *Deleted

## 2018-12-08 ENCOUNTER — Other Ambulatory Visit: Payer: Self-pay | Admitting: *Deleted

## 2018-12-08 MED ORDER — LETROZOLE 2.5 MG PO TABS: 2.5000 mg | ORAL_TABLET | Freq: Every day | ORAL | 3 refills | Status: DC

## 2018-12-08 NOTE — Telephone Encounter (Addendum)
Patient came to cancer center today to see Nira Conn, RN at 58 am (before clinic opened). Pt spoke to Gerald Stabs, Therapist, sports and left her phone number for Nira Conn, RN to contact her regarding her medications. Msg given to Nira Conn, RN at 108 am, but had already left the clinic.  I contacted the patient via pt's cell # to discuss her concerns. Pt stated that she would like to discuss her arimidex and letrazole prescriptions.  She has now 3 pills left in her letrozole bottle. [facility] previously ordered this for her and there was a delay in [facility]'s shipment of the [facility]rozole previously. Dr. B previously sent over a script for arimidex 1 mg if patient was unable to have the letrozole script filled (as previously documented). However, pt called back and stated that she was able to get the shipment of letrozole from [facility]. She elected at that time NOT to pick up the arimidex for that reason. I had cnl the script on the patient's chart. Pt stated at that time that she would continue to take the letrozole.  The patient informed me that she went to [place] to pick up another script up this week. [facility] gave her a 90 days supply of the Arimidex 1 mg. "Carlie Solorzano, they gave me 3 bottles of the 90 days worth of tablets."  I questioned the patient on this because Dr. Rogue Bussing DID NOT SEND A 90 DAYS SUPPLY OF THE ARIMIDEX to [facility]. Per our records, only a 30 days supply of the Arimidex script was sent for Arimidex 1 mg. Pt reports high anxiety of starting the arimidex as "this dose was 1mg  and the letrozole was 2.5 mg." I explained to her that the medications are in the "same drug class, but are not the same medications. These comes from different manufactures or are completely different medications." I had to reiterate this multiple times in the conversation as the patient stated, "Im fearful my cancer will come back as the 2.5 mg dosing is not as strong as the 1mg  dosing." I reassured the patient multiple times in the  conversation "that these were 2 different medications and the doses will be different, but this does not mean that they will not ineffective to treat her cancer."  The patient expressed that she would like to continue taking the letrozole. I explained to her that we have been informed "that [facility] has struggled to keep a shipment in the of letrozole in stock as it has been on backorder." I offered to send the script of letrozole 2.5 mg to a different pharmacy to see if they were able to get this for the patient. She was very adamant that she "didn't want to start the armidex 1 mg as I don't know how the new medication will work for me." She would like to call the [facility] that is close to her to see if this pharmacy can get the medication. I reiterated that she should not take both aromatase inhibitors. She would and should only take the letrozole or arimidex not both. Patient gave verbal understanding of the plan of care.  AT 1330, she contacted me back at the cancer center and decided that she did not want the arimidex any longer and would go with the letrozole script.  With Dr. Aletha Halim orders, new script for [facility] mg [facility] [place] [facility]'s preference[facility] I did follow-up with [facility] to inquire if patient was indeed dispensed a 90 days supply of the arimidex. Their  records confirm that 90 days was dispensed. I asked [facility] on [address] how they received the order for 90 days supply, when the order specifically was written for 30 days supply. She stated that the insurance required 90 days supply not 30 and this is why they gave the patient #90. I discussed that our office was not notified of this and this script was cnl. The patient was instructed previously that the script would be cnl and not to pick up this script. They stated that "they were not aware of this and that the patient was indeed given #90 tablets." per pharmacist, "pt is unable to return the  tablets as this is her policy, however, they have a pharmacy disposal for medications and she is welcome to use this if necessary." I expressed concerns with pharmacist that I would encourage the patient to dispose of this medication so that she does not get confused on which medication to take.  Also discussed with [facility] the continual ongoing concerns of multiple patients not being able to get letrozole with [facility]. She stated that [facility] uses a different dispensing agency than other local pharmacies. [facility] dispensing [facility] is not longer able to get [facility] the [facility].  Patient reminded again at 1600 - to take her unused Arimidex to the pharmacy to 'destroy properly.' I reminded her to not take both the Arimidex and the letrozole. She gave verbal understanding of this and would not take both of these medications. She thanked me for taking care of here needs and going above and beyond.

## 2018-12-11 NOTE — Telephone Encounter (Signed)
Thanks, Heather!! 

## 2019-01-15 ENCOUNTER — Inpatient Hospital Stay: Payer: Medicare Other | Admitting: Internal Medicine

## 2019-01-15 ENCOUNTER — Inpatient Hospital Stay: Payer: Medicare Other

## 2019-03-02 ENCOUNTER — Encounter (INDEPENDENT_AMBULATORY_CARE_PROVIDER_SITE_OTHER): Payer: Self-pay

## 2019-03-16 ENCOUNTER — Other Ambulatory Visit: Payer: Self-pay

## 2019-03-16 ENCOUNTER — Inpatient Hospital Stay (HOSPITAL_BASED_OUTPATIENT_CLINIC_OR_DEPARTMENT_OTHER): Payer: Medicare Other | Admitting: Internal Medicine

## 2019-03-16 ENCOUNTER — Inpatient Hospital Stay: Payer: Medicare Other | Attending: Internal Medicine

## 2019-03-16 ENCOUNTER — Encounter: Payer: Self-pay | Admitting: Internal Medicine

## 2019-03-16 ENCOUNTER — Other Ambulatory Visit: Payer: Self-pay | Admitting: *Deleted

## 2019-03-16 VITALS — BP 163/101 | HR 87 | Temp 98.0°F | Resp 20 | Ht 62.0 in | Wt 213.0 lb

## 2019-03-16 DIAGNOSIS — E119 Type 2 diabetes mellitus without complications: Secondary | ICD-10-CM | POA: Diagnosis not present

## 2019-03-16 DIAGNOSIS — Z803 Family history of malignant neoplasm of breast: Secondary | ICD-10-CM

## 2019-03-16 DIAGNOSIS — D509 Iron deficiency anemia, unspecified: Secondary | ICD-10-CM

## 2019-03-16 DIAGNOSIS — Z79899 Other long term (current) drug therapy: Secondary | ICD-10-CM | POA: Insufficient documentation

## 2019-03-16 DIAGNOSIS — C50812 Malignant neoplasm of overlapping sites of left female breast: Secondary | ICD-10-CM

## 2019-03-16 DIAGNOSIS — M25532 Pain in left wrist: Secondary | ICD-10-CM | POA: Insufficient documentation

## 2019-03-16 DIAGNOSIS — M25432 Effusion, left wrist: Secondary | ICD-10-CM | POA: Diagnosis not present

## 2019-03-16 DIAGNOSIS — R232 Flushing: Secondary | ICD-10-CM | POA: Diagnosis not present

## 2019-03-16 DIAGNOSIS — Z170 Estrogen receptor positive status [ER+]: Secondary | ICD-10-CM | POA: Insufficient documentation

## 2019-03-16 DIAGNOSIS — Z9221 Personal history of antineoplastic chemotherapy: Secondary | ICD-10-CM

## 2019-03-16 DIAGNOSIS — Z9011 Acquired absence of right breast and nipple: Secondary | ICD-10-CM | POA: Diagnosis not present

## 2019-03-16 DIAGNOSIS — Z79811 Long term (current) use of aromatase inhibitors: Secondary | ICD-10-CM | POA: Diagnosis not present

## 2019-03-16 LAB — COMPREHENSIVE METABOLIC PANEL
ALT: 30 U/L (ref 0–44)
AST: 22 U/L (ref 15–41)
Albumin: 3.9 g/dL (ref 3.5–5.0)
Alkaline Phosphatase: 87 U/L (ref 38–126)
Anion gap: 9 (ref 5–15)
BUN: 26 mg/dL — ABNORMAL HIGH (ref 8–23)
CO2: 28 mmol/L (ref 22–32)
Calcium: 9.5 mg/dL (ref 8.9–10.3)
Chloride: 105 mmol/L (ref 98–111)
Creatinine, Ser: 1.08 mg/dL — ABNORMAL HIGH (ref 0.44–1.00)
GFR calc Af Amer: >60 mL/min (ref >60)
GFR calc non Af Amer: 52 mL/min — ABNORMAL LOW (ref >60)
Glucose, Bld: 250 mg/dL — ABNORMAL HIGH (ref 70–99)
Potassium: 4.7 mmol/L (ref 3.5–5.1)
Sodium: 142 mmol/L (ref 135–145)
Total Bilirubin: 0.9 mg/dL (ref 0.3–1.2)
Total Protein: 6.8 g/dL (ref 6.5–8.1)

## 2019-03-16 LAB — URINALYSIS, COMPLETE (UACMP) WITH MICROSCOPIC
Bacteria, UA: NONE SEEN
Bilirubin Urine: NEGATIVE
Glucose, UA: 150 mg/dL — AB
Hgb urine dipstick: NEGATIVE
Ketones, ur: NEGATIVE mg/dL
Leukocytes,Ua: NEGATIVE
Nitrite: NEGATIVE
Protein, ur: NEGATIVE mg/dL
Specific Gravity, Urine: 1.027 (ref 1.005–1.030)
pH: 5 (ref 5.0–8.0)

## 2019-03-16 LAB — CBC WITH DIFFERENTIAL/PLATELET
Abs Immature Granulocytes: 0.02 10*3/uL (ref 0.00–0.07)
Basophils Absolute: 0 10*3/uL (ref 0.0–0.1)
Basophils Relative: 1 %
Eosinophils Absolute: 0.1 10*3/uL (ref 0.0–0.5)
Eosinophils Relative: 1 %
HCT: 35.2 % — ABNORMAL LOW (ref 36.0–46.0)
Hemoglobin: 10.8 g/dL — ABNORMAL LOW (ref 12.0–15.0)
Immature Granulocytes: 0 %
Lymphocytes Relative: 31 %
Lymphs Abs: 2.5 10*3/uL (ref 0.7–4.0)
MCH: 21.1 pg — ABNORMAL LOW (ref 26.0–34.0)
MCHC: 30.7 g/dL (ref 30.0–36.0)
MCV: 68.6 fL — ABNORMAL LOW (ref 80.0–100.0)
Monocytes Absolute: 0.5 10*3/uL (ref 0.1–1.0)
Monocytes Relative: 6 %
Neutro Abs: 4.8 10*3/uL (ref 1.7–7.7)
Neutrophils Relative %: 61 %
Platelets: 357 10*3/uL (ref 150–400)
RBC: 5.13 MIL/uL — ABNORMAL HIGH (ref 3.87–5.11)
RDW: 14.8 % (ref 11.5–15.5)
WBC: 8 10*3/uL (ref 4.0–10.5)
nRBC: 0 % (ref 0.0–0.2)

## 2019-03-16 LAB — FERRITIN: Ferritin: 187 ng/mL (ref 11–307)

## 2019-03-16 LAB — IRON AND TIBC
Iron: 43 ug/dL (ref 28–170)
Saturation Ratios: 13 % (ref 10.4–31.8)
TIBC: 333 ug/dL (ref 250–450)
UIBC: 290 ug/dL

## 2019-03-16 NOTE — Progress Notes (Signed)
[facility] OFFICE PROGRESS NOTE  Patient Care Team: Glendon Axe, [name], MD as Consulting Physician (General Surgery)  Cancer Staging No matching staging information was found for the patient.   Oncology History   # 1996- Right breast. 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, [name], [name], [name]; [name], 2014) 7. Patient was started on Cytoxan and Taxotere January 11, 2013; Patient is on letrozole from April of 2014  # NOV 2016- BMD- Normal.   3. BRCA negative (verbal information from [facility])        Carcinoma of overlapping sites of left breast in female, estrogen receptor positive ([place])     INTERVAL HISTORY:  Nancy Gaines 73 y.o.  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  Patient's appetite is good.  No weight loss.  Complains of left wrist pain and swelling.  Admits to possible trauma.  No pain anywhere else.   Review of Systems  Constitutional: Negative for chills, diaphoresis, fever, malaise/fatigue and weight loss.  HENT: Negative for nosebleeds and sore throat.   Eyes: Negative  for double vision.  Respiratory: Negative for cough, hemoptysis, sputum production, shortness of breath and wheezing.   Cardiovascular: Negative for chest pain, palpitations, orthopnea and leg swelling.  Gastrointestinal: Negative for abdominal pain, blood in stool, constipation, diarrhea, heartburn, melena, nausea and vomiting.  Genitourinary: Negative for dysuria, frequency and urgency.  Musculoskeletal: Positive for joint pain. Negative for back pain.  Skin: Negative.  Negative for itching and rash.  Neurological: Negative for dizziness, tingling, focal weakness, weakness and headaches.  Endo/Heme/Allergies: Does not bruise/bleed easily.  Psychiatric/Behavioral: Negative for depression. The patient is not nervous/anxious and does not have insomnia.      PAST MEDICAL HISTORY :  Past Medical History:  Diagnosis Date  . Anemia   . Breast cancer ([place]) 2014   breast- chemo  . Cancer [facility]) 1996   breast cancer- radiation  . Degenerative joint disease   . Dermatophytosis of nail   . Diabetes mellitus without complication ([place])   . Diverticulosis   . Hypercholesterolemia   . Hypertension   . Personal history of chemotherapy 2014   BREAST CA  . Personal history of radiation therapy 1996   BREAST CA    PAST SURGICAL HISTORY :   Past Surgical History:  Procedure Laterality Date  . ABDOMINAL HYSTERECTOMY    . COLONOSCOPY WITH PROPOFOL N/A 08/29/2015   Procedure: COLONOSCOPY WITH PROPOFOL;  [name], MD;  Location: [facility] ENDOSCOPY;  Service: Gastroenterology;  [name];  . DILATION AND CURETTAGE, DIAGNOSTIC / THERAPEUTIC    . MASTECTOMY Right 1996   BREAST CA    FAMILY HISTORY :  Family History  Problem Relation Age of Onset  . Breast cancer Maternal Aunt   . Breast cancer Paternal Aunt   . Breast cancer Sister 30    SOCIAL HISTORY:   Social History   Tobacco Use  . Smoking status: Never Smoker  . Smokeless tobacco: Never Used  Substance Use Topics  .  Alcohol use: No  . Drug use: No    ALLERGIES:  is allergic to lipitor [atorvastatin].  MEDICATIONS:  Current Outpatient Medications  Medication Sig Dispense Refill  . aspirin 81 MG tablet Take 81 mg by mouth daily.    . Calcium Carb-Cholecalciferol (CALCIUM 600+D3) 600-800 MG-UNIT TABS Take by mouth.    . Cholecalciferol (VITAMIN D3) 1000 UNITS CAPS Take by mouth.    . ferrous sulfate 325 (65 FE) MG tablet Take 325 mg by mouth daily with breakfast.     . glimepiride (AMARYL) 4 MG tablet TAKE ONE TABLET BY MOUTH TWICE DAILY WITH A MEAL    . glucose blood (BAYER CONTOUR NEXT TEST) test strip Use once daily. Use as instructed.    Marland Kitchen letrozole (FEMARA) 2.5 MG tablet Take 1 tablet (2.5 mg total) by mouth daily. 90 tablet 3  . losartan-hydrochlorothiazide (HYZAAR) 100-12.5 MG tablet Take 1 tablet by mouth daily.    . metFORMIN (GLUCOPHAGE) 1000 MG tablet Take 1,000 mg by mouth 2 (two) times daily with a meal.     . Multiple Vitamin (MULTI-VITAMINS) TABS Take by mouth.    . pravastatin (PRAVACHOL) 10 MG tablet Take 10 mg by mouth daily.     No current facility-administered medications for this visit.    Facility-Administered Medications Ordered in Other Visits  Medication Dose Route Frequency Provider Last Rate Last Dose  . heparin lock flush 100 unit/mL  500 Units Intravenous Once Charlaine Dalton R, MD      . heparin lock flush 100 unit/mL  500 Units Intravenous Once Charlaine Dalton R, MD      . sodium chloride flush (NS) 0.9 % injection 10 mL  10 mL Intravenous Once Charlaine Dalton R, MD      . sodium chloride flush (NS) 0.9 % injection 10 mL  10 mL Intravenous Once Charlaine Dalton R, MD      . sodium chloride flush (NS) 0.9 % injection 10 mL  10 mL Intravenous PRN Cammie Sickle, MD        PHYSICAL EXAMINATION: ECOG PERFORMANCE STATUS: 0 - Asymptomatic  BP (!) 163/101   Pulse 87   Temp 98 F (36.7 C) (Tympanic)   Resp 20   Ht '5\' 2"'  (1.575 m)   Wt 213 lb  (96.6 kg)   BMI 38.96 kg/m   Filed Weights   03/16/19 1014  Weight: 213 lb (96.6 kg)    Physical Exam  Constitutional: She is oriented to person, place, and time and well-developed, well-nourished, and in no distress.  HENT:  Head: Normocephalic and atraumatic.  Mouth/Throat: Oropharynx is clear and moist. No oropharyngeal exudate.  Eyes: Pupils are equal, round, and reactive to light.  Neck: Normal range of motion. Neck supple.  Cardiovascular: Normal rate and regular rhythm.  Pulmonary/Chest: No respiratory distress. She has no wheezes.  Abdominal: Soft. Bowel sounds are normal. She exhibits no distension and no mass. There is no abdominal tenderness. There is no rebound and no guarding.  Musculoskeletal: Normal range of motion.        General: No tenderness or edema.     Comments: Left wrist is swollen  tender.  Movement intact  Neurological: She is alert and oriented to person, place, and time.  Skin: Skin is warm.  Right and left BREAST exam (in the presence of nurse)- no unusual skin changes or dominant masses felt. Surgical scars noted. Right mastectomy noted  Psychiatric: Affect normal.    .  LABORATORY DATA:  I have reviewed the data as listed    Component Value Date/Time   NA 142 03/16/2019 1008   NA 141 05/06/2014 1039   K 4.7 03/16/2019 1008   K 4.4 05/06/2014 1039   CL 105 03/16/2019 1008   CL 104 05/06/2014 1039   CO2 28 03/16/2019 1008   CO2 30 05/06/2014 1039   GLUCOSE 250 (H) 03/16/2019 1008   GLUCOSE 184 (H) 05/06/2014 1039   BUN 26 (H) 03/16/2019 1008   BUN 21 (H) 05/06/2014 1039   CREATININE 1.08 (H) 03/16/2019 1008   CREATININE 1.04 05/06/2014 1039   CALCIUM 9.5 03/16/2019 1008   CALCIUM 9.7 05/06/2014 1039   PROT 6.8 03/16/2019 1008   PROT 7.3 05/06/2014 1039   ALBUMIN 3.9 03/16/2019 1008   ALBUMIN 3.7 05/06/2014 1039   AST 22 03/16/2019 1008   AST 21 05/06/2014 1039   ALT 30 03/16/2019 1008   ALT 63 05/06/2014 1039   ALKPHOS 87  03/16/2019 1008   ALKPHOS 102 05/06/2014 1039   BILITOT 0.9 03/16/2019 1008   BILITOT 0.7 05/06/2014 1039   GFRNONAA 52 (L) 03/16/2019 1008   GFRNONAA 56 (L) 05/06/2014 1039   GFRAA >60 03/16/2019 1008   GFRAA >60 05/06/2014 1039    No results found for: SPEP, UPEP  Lab Results  Component Value Date   WBC 8.0 03/16/2019   NEUTROABS 4.8 03/16/2019   HGB 10.8 (L) 03/16/2019   HCT 35.2 (L) 03/16/2019   MCV 68.6 (L) 03/16/2019   PLT 357 03/16/2019      Chemistry      Component Value Date/Time   NA 142 03/16/2019 1008   NA 141 05/06/2014 1039   K 4.7 03/16/2019 1008   K 4.4 05/06/2014 1039   CL 105 03/16/2019 1008   CL 104 05/06/2014 1039   CO2 28 03/16/2019 1008   CO2 30 05/06/2014 1039   BUN 26 (H) 03/16/2019 1008   BUN 21 (H) 05/06/2014 1039   CREATININE 1.08 (H) 03/16/2019 1008   CREATININE 1.04 05/06/2014 1039      Component Value Date/Time   CALCIUM 9.5 03/16/2019 1008   CALCIUM 9.7 05/06/2014 1039   ALKPHOS 87 03/16/2019 1008   ALKPHOS 102 05/06/2014 1039   AST 22 03/16/2019 1008   AST 21 05/06/2014 1039   ALT 30 03/16/2019 1008   ALT 63 05/06/2014 1039   BILITOT 0.9 03/16/2019 1008   BILITOT 0.7 05/06/2014 1039      RADIOGRAPHIC STUDIES: I have personally reviewed the radiological images as listed and agreed with the findings in the report. No results found.   ASSESSMENT & PLAN:  Carcinoma of overlapping sites of left breast in female, estrogen receptor positive ([place]) # RIGHT BREAST IPISILATERAL RECURRENCE- STAGE I Breast cancer ER/PR positive HER-2/neu negative- currently letrozole [since April 2014].    # Clinically no evidence of recurrence. Continue letrozole for now- extended  [until 2024 spring].  Stable.  #Hot flashes grade 1 stable  #Microcytic anemia- globin 11/suspect thalassemia.  Asymptomatic. colo- 2016 -wnl. Check Stool occult x3; UA.  Also check iron studies.  # BMD- normal [Nov 2017]; we plan months.  Stable.  #  DISPOSITION:  #  add iron studies/ferritin today; stoolcards x3; UA today. # BMD in 6 months # follow up in 6 months/labs-cbc/cmp; mammogram [pcp; dec 2020]-Dr.B    Orders Placed This Encounter  Procedures  . DG Bone Density    Standing Status:   Future    Standing Expiration Date:   03/15/2020    Order Specific Question:   Reason for Exam (SYMPTOM  OR DIAGNOSIS REQUIRED)    Answer:   history of breast cancer; osteoporosis screening; history of aromatase inhibitor use    Order Specific Question:   Preferred imaging location?    Answer:   [facility]  . Occult blood card to lab, stool    Standing Status:   Future    Standing Expiration Date:   03/15/2020  . Occult blood card to lab, stool    Standing Status:   Future    Standing Expiration Date:   03/15/2020  . Occult blood card to lab, stool    Standing Status:   Future    Standing Expiration Date:   03/15/2020  . Urinalysis, Complete w Microscopic    Standing Status:   Future    Number of Occurrences:   1    Standing Expiration Date:   03/15/2020  . Ferritin    Standing Status:   Future    Number of Occurrences:   1    Standing Expiration Date:   03/15/2020  . Iron and TIBC    Standing Status:   Future    Number of Occurrences:   1    Standing Expiration Date:   03/15/2020   All questions were answered. The patient knows to call the clinic with any problems, questions or concerns.      Cammie Sickle, MD 03/16/2019 2:10 PM

## 2019-03-16 NOTE — Assessment & Plan Note (Addendum)
#  RIGHT BREAST IPISILATERAL RECURRENCE- STAGE I Breast cancer ER/PR positive HER-2/neu negative- currently letrozole [since April 2014].    # Clinically no evidence of recurrence. Continue letrozole for now- extended  [until 2024 spring].  Stable.  #Hot flashes grade 1 stable  #Microcytic anemia- globin 11/suspect thalassemia.  Asymptomatic. colo- 2016 -wnl. Check Stool occult x3; UA.  Also check iron studies.  # BMD- normal [Nov 2017]; we plan months.  Stable.  # DISPOSITION:  # add iron studies/ferritin today; stoolcards x3; UA today. # BMD in 6 months # follow up in 6 months/labs-cbc/cmp; mammogram [pcp; dec 2020]-Dr.B

## 2019-03-19 DIAGNOSIS — C50812 Malignant neoplasm of overlapping sites of left female breast: Secondary | ICD-10-CM | POA: Diagnosis not present

## 2019-03-20 DIAGNOSIS — C50812 Malignant neoplasm of overlapping sites of left female breast: Secondary | ICD-10-CM | POA: Diagnosis not present

## 2019-03-21 ENCOUNTER — Other Ambulatory Visit: Payer: Self-pay

## 2019-03-21 DIAGNOSIS — C50812 Malignant neoplasm of overlapping sites of left female breast: Secondary | ICD-10-CM

## 2019-03-21 LAB — OCCULT BLOOD X 1 CARD TO LAB, STOOL
Fecal Occult Bld: NEGATIVE
Fecal Occult Bld: NEGATIVE
Fecal Occult Bld: NEGATIVE

## 2019-09-25 ENCOUNTER — Other Ambulatory Visit: Payer: Self-pay | Admitting: Internal Medicine

## 2019-09-25 DIAGNOSIS — Z1231 Encounter for screening mammogram for malignant neoplasm of breast: Secondary | ICD-10-CM

## 2019-10-04 ENCOUNTER — Other Ambulatory Visit: Payer: Self-pay | Admitting: Internal Medicine

## 2019-10-04 DIAGNOSIS — Z1231 Encounter for screening mammogram for malignant neoplasm of breast: Secondary | ICD-10-CM

## 2019-10-16 ENCOUNTER — Ambulatory Visit
Admission: RE | Admit: 2019-10-16 | Discharge: 2019-10-16 | Disposition: A | Discharge status: Home / Self Care | Payer: Medicare Other | Source: Ambulatory Visit | Attending: Internal Medicine | Admitting: Internal Medicine

## 2019-10-16 DIAGNOSIS — Z1231 Encounter for screening mammogram for malignant neoplasm of breast: Secondary | ICD-10-CM | POA: Insufficient documentation

## 2019-12-01 ENCOUNTER — Other Ambulatory Visit: Payer: Self-pay | Admitting: Internal Medicine

## 2019-12-05 ENCOUNTER — Other Ambulatory Visit: Payer: Self-pay | Admitting: *Deleted

## 2019-12-05 DIAGNOSIS — C50812 Malignant neoplasm of overlapping sites of left female breast: Secondary | ICD-10-CM

## 2019-12-05 DIAGNOSIS — D509 Iron deficiency anemia, unspecified: Secondary | ICD-10-CM

## 2019-12-06 ENCOUNTER — Other Ambulatory Visit: Payer: Self-pay

## 2019-12-06 ENCOUNTER — Inpatient Hospital Stay: Payer: Medicare Other | Attending: Internal Medicine

## 2019-12-06 ENCOUNTER — Inpatient Hospital Stay (HOSPITAL_BASED_OUTPATIENT_CLINIC_OR_DEPARTMENT_OTHER): Payer: Medicare Other | Admitting: Internal Medicine

## 2019-12-06 DIAGNOSIS — Z7982 Long term (current) use of aspirin: Secondary | ICD-10-CM | POA: Diagnosis not present

## 2019-12-06 DIAGNOSIS — E785 Hyperlipidemia, unspecified: Secondary | ICD-10-CM | POA: Insufficient documentation

## 2019-12-06 DIAGNOSIS — Z170 Estrogen receptor positive status [ER+]: Secondary | ICD-10-CM

## 2019-12-06 DIAGNOSIS — C50812 Malignant neoplasm of overlapping sites of left female breast: Secondary | ICD-10-CM | POA: Diagnosis present

## 2019-12-06 DIAGNOSIS — Z9221 Personal history of antineoplastic chemotherapy: Secondary | ICD-10-CM | POA: Diagnosis not present

## 2019-12-06 DIAGNOSIS — Z79811 Long term (current) use of aromatase inhibitors: Secondary | ICD-10-CM | POA: Insufficient documentation

## 2019-12-06 DIAGNOSIS — Z923 Personal history of irradiation: Secondary | ICD-10-CM | POA: Diagnosis not present

## 2019-12-06 DIAGNOSIS — E119 Type 2 diabetes mellitus without complications: Secondary | ICD-10-CM | POA: Insufficient documentation

## 2019-12-06 DIAGNOSIS — Z79899 Other long term (current) drug therapy: Secondary | ICD-10-CM | POA: Insufficient documentation

## 2019-12-06 DIAGNOSIS — E7800 Pure hypercholesterolemia, unspecified: Secondary | ICD-10-CM | POA: Diagnosis not present

## 2019-12-06 DIAGNOSIS — R232 Flushing: Secondary | ICD-10-CM | POA: Insufficient documentation

## 2019-12-06 DIAGNOSIS — Z7984 Long term (current) use of oral hypoglycemic drugs: Secondary | ICD-10-CM | POA: Diagnosis not present

## 2019-12-06 DIAGNOSIS — Z803 Family history of malignant neoplasm of breast: Secondary | ICD-10-CM | POA: Diagnosis not present

## 2019-12-06 DIAGNOSIS — I10 Essential (primary) hypertension: Secondary | ICD-10-CM | POA: Diagnosis not present

## 2019-12-06 DIAGNOSIS — M2550 Pain in unspecified joint: Secondary | ICD-10-CM | POA: Diagnosis not present

## 2019-12-06 DIAGNOSIS — D509 Iron deficiency anemia, unspecified: Secondary | ICD-10-CM

## 2019-12-06 LAB — CBC WITH DIFFERENTIAL/PLATELET
Abs Immature Granulocytes: 0.02 10*3/uL (ref 0.00–0.07)
Basophils Absolute: 0 10*3/uL (ref 0.0–0.1)
Basophils Relative: 1 %
Eosinophils Absolute: 0.1 10*3/uL (ref 0.0–0.5)
Eosinophils Relative: 2 %
HCT: 37.4 % (ref 36.0–46.0)
Hemoglobin: 11.1 g/dL — ABNORMAL LOW (ref 12.0–15.0)
Immature Granulocytes: 0 %
Lymphocytes Relative: 35 %
Lymphs Abs: 2.5 10*3/uL (ref 0.7–4.0)
MCH: 20.6 pg — ABNORMAL LOW (ref 26.0–34.0)
MCHC: 29.7 g/dL — ABNORMAL LOW (ref 30.0–36.0)
MCV: 69.3 fL — ABNORMAL LOW (ref 80.0–100.0)
Monocytes Absolute: 0.5 10*3/uL (ref 0.1–1.0)
Monocytes Relative: 7 %
Neutro Abs: 3.9 10*3/uL (ref 1.7–7.7)
Neutrophils Relative %: 55 %
Platelets: 352 10*3/uL (ref 150–400)
RBC: 5.4 MIL/uL — ABNORMAL HIGH (ref 3.87–5.11)
RDW: 14.7 % (ref 11.5–15.5)
WBC: 7.1 10*3/uL (ref 4.0–10.5)
nRBC: 0 % (ref 0.0–0.2)

## 2019-12-06 LAB — COMPREHENSIVE METABOLIC PANEL
ALT: 38 U/L (ref 0–44)
AST: 24 U/L (ref 15–41)
Albumin: 3.9 g/dL (ref 3.5–5.0)
Alkaline Phosphatase: 79 U/L (ref 38–126)
Anion gap: 10 (ref 5–15)
BUN: 21 mg/dL (ref 8–23)
CO2: 25 mmol/L (ref 22–32)
Calcium: 9.7 mg/dL (ref 8.9–10.3)
Chloride: 105 mmol/L (ref 98–111)
Creatinine, Ser: 0.92 mg/dL (ref 0.44–1.00)
GFR calc Af Amer: >60 mL/min (ref >60)
GFR calc non Af Amer: >60 mL/min (ref >60)
Glucose, Bld: 216 mg/dL — ABNORMAL HIGH (ref 70–99)
Potassium: 4.2 mmol/L (ref 3.5–5.1)
Sodium: 140 mmol/L (ref 135–145)
Total Bilirubin: 0.9 mg/dL (ref 0.3–1.2)
Total Protein: 6.9 g/dL (ref 6.5–8.1)

## 2019-12-06 NOTE — Progress Notes (Signed)
[facility] OFFICE PROGRESS NOTE  Patient Care Team: Glendon Axe, [name], [name], MD as Consulting Physician (Hematology and Oncology)  Cancer Staging No matching staging information was found for the patient.   Oncology History Overview Note  # 1996- Right breast. 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, [name], [name], [name]; [name], 2014) 7. Patient was started on Cytoxan and Taxotere January 11, 2013; Patient is on letrozole from April of 2014  # NOV 2016- BMD- Normal.  3. BRCA negative (verbal information from [place])  # mild anemia- ? Thalassemia- Hb ~11; [name]      ;  [name], estrogen receptor positive ([place])     INTERVAL HISTORY:  Nancy Gaines 74 y.o.  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  Patient appetite is good.   She is want to lose weight.  No bone pain.  Chronic joint pains.  No worsening hot flashes.  Review of Systems  Constitutional: Negative for chills, diaphoresis, fever, malaise/fatigue and weight loss.  HENT: Negative for nosebleeds and sore throat.   Eyes: Negative for double vision.  Respiratory: Negative for cough, hemoptysis, sputum production, shortness of breath and wheezing.   Cardiovascular: Negative for chest pain, palpitations, orthopnea and leg swelling.  Gastrointestinal: Negative for abdominal pain, blood in stool, constipation, diarrhea, heartburn, melena, nausea and vomiting.  Genitourinary: Negative for dysuria, frequency and urgency.  Musculoskeletal: Positive for joint pain. Negative for back pain.  Skin: Negative.  Negative for itching and rash.  Neurological: Negative for dizziness, tingling, focal weakness, weakness and headaches.  Endo/Heme/Allergies: Does not bruise/bleed easily.  Psychiatric/Behavioral: Negative for depression. The patient is not nervous/anxious and does not have insomnia.      PAST MEDICAL HISTORY :  Past Medical History:  Diagnosis Date  . Anemia   . Breast cancer ([place]) 2014   breast- chemo  . Cancer [facility]) 1996   breast cancer- radiation  . Degenerative joint disease   . Dermatophytosis of nail   . Diabetes mellitus without complication ([place])   . Diverticulosis   . Hypercholesterolemia   . Hypertension   . Personal history of chemotherapy 2014   BREAST CA  . Personal history of radiation therapy 1996   BREAST CA    PAST SURGICAL HISTORY :   Past Surgical History:  Procedure Laterality Date  . ABDOMINAL HYSTERECTOMY    . COLONOSCOPY  WITH PROPOFOL N/A 08/29/2015   Procedure: COLONOSCOPY WITH PROPOFOL;  [name], MD;  Location: [facility] ENDOSCOPY;  Service: Gastroenterology;  [name];  . DILATION AND CURETTAGE, DIAGNOSTIC / THERAPEUTIC    . MASTECTOMY Right 1996   BREAST CA    FAMILY HISTORY :   Family History   Problem Relation Age of Onset  . Breast cancer Maternal Aunt   . Breast cancer Paternal Aunt   . Breast cancer Sister 25    SOCIAL HISTORY:   Social History   Tobacco Use  . Smoking status: Never Smoker  . Smokeless tobacco: Never Used  Substance Use Topics  . Alcohol use: No  . Drug use: No    ALLERGIES:  is allergic to lipitor [atorvastatin].  MEDICATIONS:  Current Outpatient Medications  Medication Sig Dispense Refill  . aspirin 81 MG tablet Take 81 mg by mouth daily.    . Calcium Carb-Cholecalciferol (CALCIUM 600+D3) 600-800 MG-UNIT TABS Take by mouth.    . Cholecalciferol (VITAMIN D3) 1000 UNITS CAPS Take by mouth.    . ferrous sulfate 325 (65 FE) MG tablet Take 325 mg by mouth daily with breakfast.     . glimepiride (AMARYL) 4 MG tablet TAKE ONE TABLET BY MOUTH TWICE DAILY WITH A MEAL    . glucose blood (BAYER CONTOUR NEXT TEST) test strip Use once daily. Use as instructed.    Marland Kitchen letrozole (FEMARA) 2.5 MG tablet TAKE 1 TABLET BY MOUTH EVERY DAY 90 tablet 3  . losartan-hydrochlorothiazide (HYZAAR) 100-12.5 MG tablet Take 1 tablet by mouth daily.    . metFORMIN (GLUCOPHAGE) 1000 MG tablet Take 1,000 mg by mouth 2 (two) times daily with a meal.     . Multiple Vitamin (MULTI-VITAMINS) TABS Take by mouth.    Marland Kitchen NOVOLIN N 100 UNIT/ML injection Inject 20 Units into the skin daily before breakfast. And 10 units in the evening with dinner    . pravastatin (PRAVACHOL) 10 MG tablet Take 10 mg by mouth daily.     No current facility-administered medications for this visit.   Facility-Administered Medications Ordered in Other Visits  Medication Dose Route Frequency Provider Last Rate Last Admin  . heparin lock flush 100 unit/mL  500 Units Intravenous Once Charlaine Dalton R, MD      . heparin lock flush 100 unit/mL  500 Units Intravenous Once Charlaine Dalton R, MD      . sodium chloride flush (NS) 0.9 % injection 10 mL  10 mL Intravenous Once Charlaine Dalton R, MD      .  sodium chloride flush (NS) 0.9 % injection 10 mL  10 mL Intravenous Once Charlaine Dalton R, MD      . sodium chloride flush (NS) 0.9 % injection 10 mL  10 mL Intravenous PRN Cammie Sickle, MD        PHYSICAL EXAMINATION: ECOG PERFORMANCE STATUS: 0 - Asymptomatic  BP (!) 145/75 (Patient Position: Sitting)   Pulse 90   Temp 98.1 F (36.7 C) (Tympanic)   Resp 20   Wt 218 lb (98.9 kg)   BMI 39.87 kg/m   Filed Weights   12/06/19 0949  Weight: 218 lb (98.9 kg)    Physical Exam  Constitutional: She is oriented to person, place, and time and well-developed, well-nourished, and in no distress.  HENT:  Head: Normocephalic and atraumatic.  Mouth/Throat: Oropharynx is clear and moist. No oropharyngeal exudate.  Eyes: Pupils are equal, round, and reactive to light.  Cardiovascular: Normal rate and  regular rhythm.  Pulmonary/Chest: No respiratory distress. She has no wheezes.  Abdominal: Soft. Bowel sounds are normal. She exhibits no distension and no mass. There is no abdominal tenderness. There is no rebound and no guarding.  Musculoskeletal:        General: No tenderness or edema. Normal range of motion.     Cervical back: Normal range of motion and neck supple.     Comments: Left wrist is swollen tender.  Movement intact  Neurological: She is alert and oriented to person, place, and time.  Skin: Skin is warm.  Right and left BREAST exam (in the presence of nurse)- no unusual skin changes or dominant masses felt. Surgical scars noted. Right mastectomy noted  Psychiatric: Affect normal.    .  LABORATORY DATA:  I have reviewed the data as listed    Component Value Date/Time   NA 140 12/06/2019 0937   NA 141 05/06/2014 1039   K 4.2 12/06/2019 0937   K 4.4 05/06/2014 1039   CL 105 12/06/2019 0937   CL 104 05/06/2014 1039   CO2 25 12/06/2019 0937   CO2 30 05/06/2014 1039   GLUCOSE 216 (H) 12/06/2019 0937   GLUCOSE 184 (H) 05/06/2014 1039   BUN 21 12/06/2019 0937    BUN 21 (H) 05/06/2014 1039   CREATININE 0.92 12/06/2019 0937   CREATININE 1.04 05/06/2014 1039   CALCIUM 9.7 12/06/2019 0937   CALCIUM 9.7 05/06/2014 1039   PROT 6.9 12/06/2019 0937   PROT 7.3 05/06/2014 1039   ALBUMIN 3.9 12/06/2019 0937   ALBUMIN 3.7 05/06/2014 1039   AST 24 12/06/2019 0937   AST 21 05/06/2014 1039   ALT 38 12/06/2019 0937   ALT 63 05/06/2014 1039   ALKPHOS 79 12/06/2019 0937   ALKPHOS 102 05/06/2014 1039   BILITOT 0.9 12/06/2019 0937   BILITOT 0.7 05/06/2014 1039   GFRNONAA >60 12/06/2019 0937   GFRNONAA 56 (L) 05/06/2014 1039   GFRAA >60 12/06/2019 0937   GFRAA >60 05/06/2014 1039    No results found for: SPEP, UPEP  Lab Results  Component Value Date   WBC 7.1 12/06/2019   NEUTROABS 3.9 12/06/2019   HGB 11.1 (L) 12/06/2019   HCT 37.4 12/06/2019   MCV 69.3 (L) 12/06/2019   PLT 352 12/06/2019      Chemistry      Component Value Date/Time   NA 140 12/06/2019 0937   NA 141 05/06/2014 1039   K 4.2 12/06/2019 0937   K 4.4 05/06/2014 1039   CL 105 12/06/2019 0937   CL 104 05/06/2014 1039   CO2 25 12/06/2019 0937   CO2 30 05/06/2014 1039   BUN 21 12/06/2019 0937   BUN 21 (H) 05/06/2014 1039   CREATININE 0.92 12/06/2019 0937   CREATININE 1.04 05/06/2014 1039      Component Value Date/Time   CALCIUM 9.7 12/06/2019 0937   CALCIUM 9.7 05/06/2014 1039   ALKPHOS 79 12/06/2019 0937   ALKPHOS 102 05/06/2014 1039   AST 24 12/06/2019 0937   AST 21 05/06/2014 1039   ALT 38 12/06/2019 0937   ALT 63 05/06/2014 1039   BILITOT 0.9 12/06/2019 0937   BILITOT 0.7 05/06/2014 1039      RADIOGRAPHIC STUDIES: I have personally reviewed the radiological images as listed and agreed with the findings in the report. No results found.   ASSESSMENT & PLAN:  Carcinoma of overlapping sites of left breast in female, estrogen receptor positive ([place]) # RIGHT BREAST IPISILATERAL RECURRENCE- STAGE  I Breast cancer ER/PR positive HER-2/neu negative- currently letrozole  [since April 2014].    # Clinically no evidence of recurrence. Continue letrozole for now- extended  [until 2024 spring].  Stable.  # Microcytic anemia- globin 11/suspect thalassemia.  Asymptomatic; STABLE.   # BMD- normal [Nov 2017]; will order bone density test today.  Discussed the risk of osteopenia/osteoporosis; and treatment options.  Continue calcium plus vitamin D.  Reluctant with therapeutics at this time.   # # I discussed regarding Covid-19 precautions.  I reviewed the vaccine effectiveness and potential side effects in detail.  Also discussed long-term effectiveness and safety profile are unclear at this time.  I discussed December, 2020 ASCO position statement-that all patients are recommended COVID-19 vaccinations [when available]-as long as they do not have allergy to components of the vaccine.  However, I think the benefits of the vaccination outweigh the potential risks. Re: GLOVF-64 vaccination.  For more information/scheduling recommend call [phone], 8:30am-4:30pm.   # # SURVIVORSHIP- I introduced to the patient the philosophy & goals of cancer survivorship program.  I also discussed the available resources and support through the cancer survivorship program our center.  Patient  is interested. We will make a referral.   # DISPOSITION:  Will call with bmd/print copy of labs-   # BMD in 1 week # follow up in 6 months-MDlabs-cbc/cmp;Dr.B    Orders Placed This Encounter  Procedures  . MM 3D SCREEN BREAST UNI LEFT    Standing Status:   Future    Standing Expiration Date:   02/02/2021    Order Specific Question:   Reason for Exam (SYMPTOM  OR DIAGNOSIS REQUIRED)    Answer:   history of breast cancer    Order Specific Question:   Preferred imaging location?    Answer:   [facility]  . CBC with Differential    Standing Status:   Future    Standing Expiration Date:   12/05/2020  . Comprehensive metabolic panel    Standing Status:    Future    Standing Expiration Date:   12/05/2020  . AMB referral to Navigator    Referral Priority:   Routine    Referral Type:   Consultation    Number of Visits Requested:   1   All questions were answered. The patient knows to call the clinic with any problems, questions or concerns.      Cammie Sickle, MD 12/06/2019 1:46 PM

## 2019-12-06 NOTE — Assessment & Plan Note (Addendum)
#  RIGHT BREAST IPISILATERAL RECURRENCE- STAGE I Breast cancer ER/PR positive HER-2/neu negative- currently letrozole [since April 2014].    # Clinically no evidence of recurrence. Continue letrozole for now- extended  [until 2024 spring].  Stable.  # Microcytic anemia- globin 11/suspect thalassemia.  Asymptomatic; STABLE.   # BMD- normal [Nov 2017]; will order bone density test today.  Discussed the risk of osteopenia/osteoporosis; and treatment options.  Continue calcium plus vitamin D.  Reluctant with therapeutics at this time.   # # I discussed regarding Covid-19 precautions.  I reviewed the vaccine effectiveness and potential side effects in detail.  Also discussed long-term effectiveness and safety profile are unclear at this time.  I discussed December, 2020 ASCO position statement-that all patients are recommended COVID-19 vaccinations [when available]-as long as they do not have allergy to components of the vaccine.  However, I think the benefits of the vaccination outweigh the potential risks. Re: PKGYB-71 vaccination.  For more information/scheduling recommend call [phone], 8:30am-4:30pm.   # # SURVIVORSHIP- I introduced to the patient the philosophy & goals of cancer survivorship program.  I also discussed the available resources and support through the cancer survivorship program our center.  Patient  is interested. We will make a referral.   # DISPOSITION:  Will call with bmd/print copy of labs-   # BMD in 1 week # follow up in 6 months-MDlabs-cbc/cmp;Dr.B

## 2019-12-11 ENCOUNTER — Telehealth: Payer: Self-pay

## 2019-12-11 NOTE — Telephone Encounter (Signed)
Survivorship Care Plan visit completed.  Treatment summary reviewed and mailed to patient.  ASCO answers booklet reviewed and mailed to patient.  CARE program and Cancer Transitions discussed with patient along with other resources cancer center offers to patients and caregivers.  Patient verbalized understanding.  Informed patient APP will call her in 2 weeks after receiving SCP packet and discuss Survivorship Clinic.   Packet mailed.

## 2019-12-13 ENCOUNTER — Ambulatory Visit
Admission: RE | Admit: 2019-12-13 | Discharge: 2019-12-13 | Disposition: A | Discharge status: Home / Self Care | Payer: Medicare Other | Source: Ambulatory Visit | Attending: Internal Medicine | Admitting: Internal Medicine

## 2019-12-13 DIAGNOSIS — Z79811 Long term (current) use of aromatase inhibitors: Secondary | ICD-10-CM | POA: Insufficient documentation

## 2019-12-13 DIAGNOSIS — Z170 Estrogen receptor positive status [ER+]: Secondary | ICD-10-CM | POA: Insufficient documentation

## 2019-12-13 DIAGNOSIS — C50812 Malignant neoplasm of overlapping sites of left female breast: Secondary | ICD-10-CM | POA: Diagnosis present

## 2019-12-14 ENCOUNTER — Ambulatory Visit: Payer: Medicare Other | Attending: Internal Medicine

## 2019-12-14 DIAGNOSIS — Z23 Encounter for immunization: Secondary | ICD-10-CM | POA: Insufficient documentation

## 2019-12-14 NOTE — Progress Notes (Signed)
   Covid-19 Vaccination Clinic  Name:  Nancy Gaines    MRN: [id number] DOB: December 10, 1945  12/14/2019  [name] was observed post Covid-19 immunization for 15 minutes without incidence. She was provided with Vaccine Information Sheet and instruction to access the V-Safe system.   [name] was instructed to call 911 with any severe reactions post vaccine: Marland Kitchen Difficulty breathing  . Swelling of your face and throat  . A fast heartbeat  . A bad rash all over your body  . Dizziness and weakness    Immunizations Administered    Name Date Dose VIS Date Route   [name] COVID-19 Vaccine 12/14/2019  9:27 AM 0.3 mL 10/12/2019 Intramuscular   Manufacturer: [place]   Lot: [id number]   [place]: [id number]

## 2019-12-19 ENCOUNTER — Telehealth: Payer: Self-pay | Admitting: *Deleted

## 2019-12-19 NOTE — Telephone Encounter (Signed)
Spoke with patient. Results of bone density reviewed with the patient. She gave verbal understanding of the plan of care.

## 2019-12-19 NOTE — Telephone Encounter (Signed)
-----   Message from Cammie Sickle, MD sent at 12/17/2019  2:56 PM EST ----- H/C- please inform pt that her BMD- is normal. Continue ca+vitD; otherwise no new recommendations, GB

## 2019-12-25 ENCOUNTER — Inpatient Hospital Stay (HOSPITAL_BASED_OUTPATIENT_CLINIC_OR_DEPARTMENT_OTHER): Payer: Medicare Other | Admitting: Nurse Practitioner

## 2019-12-25 ENCOUNTER — Other Ambulatory Visit: Payer: Self-pay

## 2019-12-25 DIAGNOSIS — C50812 Malignant neoplasm of overlapping sites of left female breast: Secondary | ICD-10-CM | POA: Diagnosis not present

## 2019-12-25 DIAGNOSIS — Z170 Estrogen receptor positive status [ER+]: Secondary | ICD-10-CM

## 2019-12-25 DIAGNOSIS — I10 Essential (primary) hypertension: Secondary | ICD-10-CM

## 2019-12-25 DIAGNOSIS — Z79810 Long term (current) use of selective estrogen receptor modulators (SERMs): Secondary | ICD-10-CM

## 2019-12-25 DIAGNOSIS — E785 Hyperlipidemia, unspecified: Secondary | ICD-10-CM

## 2019-12-25 DIAGNOSIS — Z79899 Other long term (current) drug therapy: Secondary | ICD-10-CM

## 2019-12-25 DIAGNOSIS — Z794 Long term (current) use of insulin: Secondary | ICD-10-CM

## 2019-12-25 DIAGNOSIS — E119 Type 2 diabetes mellitus without complications: Secondary | ICD-10-CM

## 2019-12-25 DIAGNOSIS — Z803 Family history of malignant neoplasm of breast: Secondary | ICD-10-CM

## 2019-12-25 DIAGNOSIS — Z7982 Long term (current) use of aspirin: Secondary | ICD-10-CM

## 2019-12-25 NOTE — Progress Notes (Signed)
Survivorship Clinic Consult Note [facility]  Telephone:([phone] [phone] Virtual Visit Progress Note  I connected with Nancy Gaines on 12/25/19 at 10:00 AM EST by telephone visit and verified that I am speaking with the correct person using two identifiers.   I discussed the limitations, risks, security and privacy concerns of performing an evaluation and management service by telemedicine and the availability of in-person appointments. I also discussed with the patient that there may be a patient responsible charge related to this service. The patient expressed understanding and agreed to proceed.   Other persons participating in the visit and their role in the encounter: Magdalene Patricia, RN   Patient's location: home Provider's location: home  CLINIC: Survivorship  REASON FOR VISIT: Long-term survivorship surveillance visit for history of breast cancer  BRIEF ONCOLOGIC HISTORY:  Oncology History Overview Note  # 1996- Right breast. 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, [name], [name], [name]; [name], 2014) 7. Patient was started on Cytoxan and Taxotere January 11, 2013; Patient is on letrozole from April of 2014  # NOV 2016- BMD- Normal.  3.  BRCA negative (verbal information from [place])  # mild anemia- ? Thalassemia- Hb ~11; [name]      ;  [name], estrogen receptor positive ([place])    INTERVAL HISTORY:  Patient presents to the survivorship clinic today for initial meeting to review her survivorship care plan detailing her treatment course for breast cancer, as well as monitoring long-term side effects of that treatment, education regarding health maintenance, screening, and overall wellness and health promotion.  Overall, she reports feeling well since completing treatment.  She offers no specific complaints today.  REVIEW OF SYSTEMS:  Review of Systems  Constitutional: Negative for chills, fever, malaise/fatigue and weight loss.  HENT: Negative for hearing loss, nosebleeds, sore throat and tinnitus.   Eyes: Negative for blurred vision and double vision.  Respiratory: Negative for cough, hemoptysis, shortness of breath and wheezing.   Cardiovascular: Negative for chest pain, palpitations and leg swelling.  Gastrointestinal: Negative for abdominal pain, blood in stool, constipation, diarrhea, melena, nausea and vomiting.  Genitourinary: Negative for dysuria and urgency.  Musculoskeletal: Negative for back pain, falls, joint pain and myalgias.  Skin: Negative for itching and rash.  Neurological: Negative for dizziness, tingling, sensory change, loss of consciousness, weakness and headaches.  Endo/Heme/Allergies: Negative for environmental allergies. Does not bruise/bleed easily.  Psychiatric/Behavioral: Negative for depression. The patient is not nervous/anxious and does not have insomnia.   Breast: No lumps, bumps, skin changes, pain, discharge  ONCOLOGY TREATMENT TEAM:  1. Surgeon:  Dr. Tamala Julian; now followed by Dr.  Windell Moment 2. Medical Oncologist: Dr. Rogue Bussing   PAST MEDICAL/SURGICAL HISTORY:  Past Medical History:  Diagnosis Date  . Anemia   . Breast cancer ([place]) 2014   breast- chemo  . Cancer [facility]) 1996   breast cancer- radiation  . Degenerative joint disease   . Dermatophytosis of nail   . Diabetes mellitus without complication ([place])   . Diverticulosis   . Hypercholesterolemia   . Hypertension   . Personal history of chemotherapy 2014   BREAST CA  . Personal history of radiation therapy 1996   BREAST CA   Past Surgical History:  Procedure Laterality Date  . ABDOMINAL HYSTERECTOMY    . COLONOSCOPY WITH PROPOFOL N/A 08/29/2015   Procedure: COLONOSCOPY WITH PROPOFOL;  [name], MD;  Location: [facility] ENDOSCOPY;  Service: Gastroenterology;  [name];  . DILATION AND CURETTAGE, DIAGNOSTIC / THERAPEUTIC    . MASTECTOMY Right 1996   BREAST CA    SOCIAL HISTORY:  Social History   Socioeconomic History  . Marital status: Divorced    Spouse name: Not on file  . Number of children: Not on file  . Years of education: Not on file  . Highest education level: Not on file  Occupational History  . Not on file  Tobacco Use  . Smoking status: Never Smoker  . Smokeless tobacco: Never Used  Substance and Sexual Activity  . Alcohol use: No  . Drug use: No  . Sexual activity: Not on file  Other Topics Concern  . Not on file  Social History Narrative  . Not on file   Social Determinants of Health   Financial Resource Strain:   . Difficulty of Paying Living Expenses: Not on file  Food Insecurity:   . Worried About Charity fundraiser in the Last Year: Not on file  . Ran Out of Food in the Last Year: Not on file  Transportation Needs:   . Lack of Transportation (Medical): Not on file  . Lack of Transportation (Non-Medical): Not on file  Physical Activity:   . Days of Exercise per Week: Not on file  . Minutes of Exercise per Session: Not on file  Stress:   .  Feeling of Stress : Not on file  Social Connections:   . Frequency of Communication with Friends and Family: Not on file  . Frequency of Social Gatherings with Friends and Family: Not on file  . Attends Religious Services: Not on file  . Active Member of Clubs or Organizations: Not on file  . Attends Archivist Meetings: Not on file  . Marital Status: Not on file    ALLERGIES:  Allergies  Allergen Reactions  . Lipitor [Atorvastatin] Other (See Comments)    CURRENT MEDICATIONS:  Outpatient Encounter Medications as of 12/25/2019  Medication Sig Note  . aspirin 81 MG tablet Take 81 mg by mouth daily.   . Calcium Carb-Cholecalciferol (CALCIUM 600+D3) 600-800 MG-UNIT TABS Take by mouth.   . Cholecalciferol (VITAMIN D3) 1000 UNITS CAPS Take by mouth. 07/10/2015: Received from: [place]  . ferrous sulfate 325 (65 FE) MG tablet Take 325 mg by mouth daily with breakfast.  07/10/2015: Received from: [facility]  . glimepiride (AMARYL) 4 MG tablet TAKE ONE TABLET BY MOUTH TWICE DAILY WITH A MEAL 07/15/2016: Received from: [facility]  . glucose blood (BAYER CONTOUR NEXT TEST) test strip Use  once daily. Use as instructed. 07/10/2015: Received from: [place]  . letrozole (FEMARA) 2.5 MG tablet TAKE 1 TABLET BY MOUTH EVERY DAY   . losartan-hydrochlorothiazide (HYZAAR) 100-12.5 MG tablet Take 1 tablet by mouth daily.   . metFORMIN (GLUCOPHAGE) 1000 MG tablet Take 1,000 mg by mouth 2 (two) times daily with a meal.  07/15/2016: Received from: [place]: Take 1 tablet (1,000 mg total) by mouth 2 (two) times daily with meals.  . Multiple Vitamin (MULTI-VITAMINS) TABS Take by mouth. 07/10/2015: Received from: [place]  . NOVOLIN N 100 UNIT/ML injection Inject 20 Units into the skin daily before breakfast. And 10 units in the evening with dinner   . pravastatin (PRAVACHOL) 10 MG  tablet Take 10 mg by mouth daily.    Facility-Administered Encounter Medications as of 12/25/2019  Medication  . heparin lock flush 100 unit/mL  . heparin lock flush 100 unit/mL  . sodium chloride flush (NS) 0.9 % injection 10 mL  . sodium chloride flush (NS) 0.9 % injection 10 mL  . sodium chloride flush (NS) 0.9 % injection 10 mL    ONCOLOGIC FAMILY HISTORY:  Family History  Problem Relation Age of Onset  . Breast cancer Maternal Aunt   . Breast cancer Paternal Aunt   . Breast cancer Sister 39    GENETIC COUNSELING/TESTING: Per patient and prior notes: BRCA negative  PHYSICAL EXAMINATION:  Exam limited due to telemedicine  LABORATORY DATA:  Lab Results  Component Value Date   LABCA2 27.1 07/10/2015    IMAGING STUDIES:  10/16/2019- MM 3D screen Breast Uni Left Bi-rads category 1: negative without mammographic evidence of malignancy.   ASSESSMENT & PLAN:  Ms. Asa is a pleasant 74 y.o. female with history of right breast ipsilateral recurrent stage I breast cancer ER/PR positive HER-2/neu negative in 2014.  She is currently on letrozole since April 2014 and will continue until April 2024.  She presents to the Survivorship clinic for our initial meeting and routine follow-up since completing treatment. We reviewed her survivorship care plan and addressed any acute survivorship concerns since completing treatment.   1. History of stage recurrent stage I breast cancer: Ms. Emrich is continuing to recover from definitive treatment for breast cancer.  Today, she received a copy of her comprehensive survivorship care plan (SCP) which was reviewed with her in detail.  The SCP details her cancer diagnosis, treatment course, potential late/long-term effects of treatments appropriate follow-up care with recommendations for future, and patient education resources.  A copy of this summary, along with a letter will be sent to the patient's primary care provider via mail/backslash in  basket after today's visit.  We encouraged her to continue to follow-up with her health care team to continue to monitor help and manage any effects of treatment.  We discussed that there is a chance for the cancer can come back.  The vast majority of recurrences will be detected in the 2-3 years after treatment though late recurrences can occur. She is currently nearly 7 years from completing treatment. We discussed that surveillance recommendations are generally based on risk however, from her generally exams every 6 to 12 months would be indicated given length of time since her diagnosis. We discussed that the goal of surveillance after treatment is to detect disease if it returns as early as possible. The most useful tools for detecting recurrence include a thorough evaluation of symptoms and a physical exam which includes a  complete breast exam and yearly mammograms or other imaging studies. Additional imaging may be considered based on symptoms or examination findings which is why is it is important to report concerns to her health care team.  Blood work may be performed at these visits for if there are symptoms or exam findings concerning for recurrence.  Patient was advised that if she feels that something is not right she should schedule an appointment to be evaluated.  Concerning symptoms discussed in detail.   Ms. Weidmann will return to the survivorship clinic as needed and continue scheduled followups with her healthcare team as outlined in her SCP.   2. Survivorship Care Survey: Survivorship Care Survey completed post-treatment as recommended by the NCCN Survivorship Guidelines. Patient's answers were reviewed. Based on these, as well as the type of cancer and treatment she underwent, the following Survivorship Topics were discussed:    A) Anxiety, Depression, and Distress- denies B) Cognitive Function- denies C) Fatigue- denies D) Lymphedema- denies E) Hormone-Related Symptoms- She has  occasional hot flashes but feels these are mild to minimal and not bothersome. F) Pain- denies G) Sexual Function- denies H) Sleep Disorder- denies I) Healthy Lifestyle- see below J) Immunizations and Infections- see below  3. Smoking cessation/Tobacco Avoidance: I commended Ms. Lemonds continued efforts to remain tobacco-free.  We discussed that this is an important risk reducing strategy for both cancer recurrence and avoidance of second cancers.  She is committed to abstaining from tobacco.    4. Cancer screening/Health & Wellness Promotion:  Due to Ms. Gulotta history and her age, she should receive screening for skin cancers, colon cancer. The information and recommendations are listed on the patient's comprehensive care plan/treatment summary and were reviewed in detail with the patient.  I encouraged her to speak with her PCP about arranging these and performing annual wellness exams, as appropriate.    Breast cancer-for the average risk patient, screening for breast cancer with mammogram and clinical examination is recommended every 1-2 years beginning at age 100.  If you underwent a genetic testing and were found to be positive for a gene that increases your personal risk of breast cancer, additional screening options may be recommended.  Colorectal cancer-for the average risk patient, screening for colon cancer is recommended beginning at age 22 and generally these are stopped by age 52.  Various screening strategies exist including colonoscopy, sigmoidoscopy, stool testing for blood, etc.  You can discuss these with your primary care provider to determine which option is best for you.    5. Bone Density-bone mineral density testing is typically recommended after 32 or younger women with medical conditions or use of medications associated with low bone mass or bone loss.  Additional supplementation of calcium and/or vitamin D may be recommended.  Left food sources of calcium are ideal, a  supplement can be taken to total 1200 mg/day and should be taken with food to enhance absorption.  The recommended dose of vitamin D is 5146677217 IU daily. Continue weightbearing exercise as tolerated.  Last bone density scan on 12/13/2019.  Plan to repeat in 12/2021.  6. Physical activity/Healthy eating: Getting adequate physical activity and maintaining a healthy diet as a cancer survivor is important for overall wellness and reduces the risk of cancer recurrence.   We reviewed the "Nutrition Rainbow" handout, as well as the handout "Take Control of Your Health and Reduce Your Cancer Risk" from the [employer].  She was also encouraged to engage in moderate to vigorous  exercise for 30 minutes per day most days of the week. We discussed the CARE program which is offered 2 days a week at [facility] without cost to cancer survivors.  We discussed that the care program is currently on hold due to the COVID-19 pandemic but can consider referral in the future if patient wishes. We also discussed the importance and health benefits of maintaining a healthy weight and eating a balanced diet. Ms. Apodaca was encouraged to consume 5-7 servings of fruits and vegetables per day. We discussed that a healthy BMI is 18.5-24.9 and that maintaining a health weight reduces risk of cancer recurrences.   7. Support services/Counseling: Ms. Birchler was seen today in in effort to address both the physical and social concerns of our cancer survivors at [facility]. It is not uncommon for this period of the patient's cancer care trajectory to be one of many emotions and stressors.  I provided support today through active listening, validation of concerns, and expressive supportive counseling.  Ms. Keniston was encouraged to take advantage of our support services programs and support groups to better cope in her new life as a cancer survivor after completing anti-cancer treatment. We also discussed Counseling  Services available to Cancer Survivors with [facility] by self-referral by calling [phone].  Dispo:  - Return to [facility] for follow-up with Medical Oncology, Dr. Rogue Bussing on 06/05/20 at 10:15 for labs and re-evaluation as scheduled.  - Return to survivorship clinic as needed; no additional follow-up needed at this time.   I discussed the assessment and treatment plan with the patient. The patient was provided an opportunity to ask questions and all were answered. The patient agreed with the plan and demonstrated an understanding of the instructions.   The patient was advised to call back or seek an in-person evaluation if the symptoms worsen or if the condition fails to improve as anticipated.   I provided 20 minutes of non face-to-face telephone visit time during this encounter, and > 50% was spent counseling as documented under my assessment & plan.  Beckey Rutter, DNP, AGNP-C [place] at [facility]  Note: [place] Cammie Sickle, [place] [phone]

## 2020-01-04 ENCOUNTER — Encounter: Payer: Self-pay | Admitting: Nurse Practitioner

## 2020-01-08 ENCOUNTER — Ambulatory Visit: Payer: Medicare Other | Attending: Internal Medicine

## 2020-01-08 DIAGNOSIS — Z23 Encounter for immunization: Secondary | ICD-10-CM | POA: Insufficient documentation

## 2020-01-08 NOTE — Progress Notes (Signed)
   Covid-19 Vaccination Clinic  Name:  Nancy Gaines    MRN: [id number] DOB: 09-22-1946  01/08/2020  Nancy Gaines was observed post Covid-19 immunization for 15 minutes without incident. She was provided with Vaccine Information Sheet and instruction to access the V-Safe system.   Nancy Gaines was instructed to call 911 with any severe reactions post vaccine: Marland Kitchen Difficulty breathing  . Swelling of face and throat  . A fast heartbeat  . A bad rash all over body  . Dizziness and weakness   Immunizations Administered    Name Date Dose VIS Date Route   [name] COVID-19 Vaccine 01/08/2020  9:29 AM 0.3 mL 10/12/2019 Intramuscular   Manufacturer: [place]   Lot: [id number]   [place]: [id number]

## 2020-06-05 ENCOUNTER — Inpatient Hospital Stay: Payer: Medicare Other | Attending: Internal Medicine

## 2020-06-05 ENCOUNTER — Encounter: Payer: Self-pay | Admitting: Internal Medicine

## 2020-06-05 ENCOUNTER — Inpatient Hospital Stay (HOSPITAL_BASED_OUTPATIENT_CLINIC_OR_DEPARTMENT_OTHER): Payer: Medicare Other | Admitting: Internal Medicine

## 2020-06-05 ENCOUNTER — Other Ambulatory Visit: Payer: Self-pay

## 2020-06-05 DIAGNOSIS — C50812 Malignant neoplasm of overlapping sites of left female breast: Secondary | ICD-10-CM | POA: Insufficient documentation

## 2020-06-05 DIAGNOSIS — Z803 Family history of malignant neoplasm of breast: Secondary | ICD-10-CM | POA: Diagnosis not present

## 2020-06-05 DIAGNOSIS — E119 Type 2 diabetes mellitus without complications: Secondary | ICD-10-CM | POA: Diagnosis not present

## 2020-06-05 DIAGNOSIS — D509 Iron deficiency anemia, unspecified: Secondary | ICD-10-CM | POA: Diagnosis not present

## 2020-06-05 DIAGNOSIS — Z9011 Acquired absence of right breast and nipple: Secondary | ICD-10-CM | POA: Diagnosis not present

## 2020-06-05 DIAGNOSIS — Z90710 Acquired absence of both cervix and uterus: Secondary | ICD-10-CM | POA: Diagnosis not present

## 2020-06-05 DIAGNOSIS — Z170 Estrogen receptor positive status [ER+]: Secondary | ICD-10-CM | POA: Diagnosis not present

## 2020-06-05 LAB — CBC WITH DIFFERENTIAL/PLATELET
Abs Immature Granulocytes: 0.01 10*3/uL (ref 0.00–0.07)
Basophils Absolute: 0 10*3/uL (ref 0.0–0.1)
Basophils Relative: 1 %
Eosinophils Absolute: 0.1 10*3/uL (ref 0.0–0.5)
Eosinophils Relative: 2 %
HCT: 37.5 % (ref 36.0–46.0)
Hemoglobin: 11.7 g/dL — ABNORMAL LOW (ref 12.0–15.0)
Immature Granulocytes: 0 %
Lymphocytes Relative: 41 %
Lymphs Abs: 2.7 10*3/uL (ref 0.7–4.0)
MCH: 21.3 pg — ABNORMAL LOW (ref 26.0–34.0)
MCHC: 31.2 g/dL (ref 30.0–36.0)
MCV: 68.2 fL — ABNORMAL LOW (ref 80.0–100.0)
Monocytes Absolute: 0.5 10*3/uL (ref 0.1–1.0)
Monocytes Relative: 7 %
Neutro Abs: 3.3 10*3/uL (ref 1.7–7.7)
Neutrophils Relative %: 49 %
Platelets: 336 10*3/uL (ref 150–400)
RBC: 5.5 MIL/uL — ABNORMAL HIGH (ref 3.87–5.11)
RDW: 15 % (ref 11.5–15.5)
WBC: 6.6 10*3/uL (ref 4.0–10.5)
nRBC: 0 % (ref 0.0–0.2)

## 2020-06-05 LAB — COMPREHENSIVE METABOLIC PANEL
ALT: 42 U/L (ref 0–44)
AST: 30 U/L (ref 15–41)
Albumin: 4 g/dL (ref 3.5–5.0)
Alkaline Phosphatase: 79 U/L (ref 38–126)
Anion gap: 11 (ref 5–15)
BUN: 23 mg/dL (ref 8–23)
CO2: 25 mmol/L (ref 22–32)
Calcium: 9.7 mg/dL (ref 8.9–10.3)
Chloride: 102 mmol/L (ref 98–111)
Creatinine, Ser: 0.96 mg/dL (ref 0.44–1.00)
GFR calc Af Amer: >60 mL/min (ref >60)
GFR calc non Af Amer: 59 mL/min — ABNORMAL LOW (ref >60)
Glucose, Bld: 227 mg/dL — ABNORMAL HIGH (ref 70–99)
Potassium: 4.3 mmol/L (ref 3.5–5.1)
Sodium: 138 mmol/L (ref 135–145)
Total Bilirubin: 1.1 mg/dL (ref 0.3–1.2)
Total Protein: 6.9 g/dL (ref 6.5–8.1)

## 2020-06-05 NOTE — Assessment & Plan Note (Addendum)
#  RIGHT BREAST IPISILATERAL RECURRENCE- STAGE I Breast cancer ER/PR positive HER-2/neu negative- currently letrozole [since April 2014].    # Clinically no evidence of recurrence. Continue letrozole for now- extended  [until 2024 spring].  STABLE.   # Microcytic anemia- hemoglobin 11.7/suspect thalassemia.  Asymptomatic;STABLE.   # BMD- T score+-0.3; normal [FEB 2021];  Continue calcium plus vitamin D.   # DM- BG 223- fasting-poorly controlled recommend follow up with PCP.    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

## 2020-06-05 NOTE — Progress Notes (Signed)
[facility] OFFICE PROGRESS NOTE  Patient Care Team: Cammie Sickle, [name], [name], MD as Consulting Physician (Hematology and Oncology)  Cancer Staging No matching staging information was found for the patient.   Oncology History Overview Note  # 1996- Right breast. 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, [name], [name], [name]; [name], 2014) 7. Patient was started on Cytoxan and Taxotere January 11, 2013; Patient is on letrozole from April of 2014  # NOV 2016- BMD- Normal.  3. BRCA negative (verbal information from [place])  # mild anemia- ? Thalassemia- Hb ~11; [name]      ;  [name], estrogen receptor positive ([place])     INTERVAL HISTORY:  Nancy Gaines 74 y.o.  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  Patient notes she  has slight is discoloration of her right fifth toe nail-however been there for years.  Denies any new lumps or bumps.  Appetite is good no weight loss or nausea vomiting.   Review of Systems  Constitutional: Negative for chills, diaphoresis, fever, malaise/fatigue and weight loss.  HENT: Negative for nosebleeds and sore throat.   Eyes: Negative for double vision.  Respiratory: Negative for cough, hemoptysis, sputum production, shortness of breath and wheezing.   Cardiovascular: Negative for chest pain, palpitations, orthopnea and leg swelling.  Gastrointestinal: Negative for abdominal pain, blood in stool, constipation, diarrhea, heartburn, melena, nausea and vomiting.  Genitourinary: Negative for dysuria, frequency and urgency.  Musculoskeletal: Positive for joint pain. Negative for back pain.  Skin: Negative.  Negative for itching and rash.  Neurological: Negative for dizziness, tingling, focal weakness, weakness and headaches.  Endo/Heme/Allergies: Does not bruise/bleed easily.  Psychiatric/Behavioral: Negative for depression. The patient is not nervous/anxious and does not have insomnia.      PAST MEDICAL HISTORY :  Past Medical History:  Diagnosis Date  . Anemia   . Breast cancer ([place]) 2014   breast- chemo  . Cancer [facility]) 1996   breast cancer- radiation  . Degenerative joint disease   . Dermatophytosis of nail   . Diabetes mellitus without complication ([place])   . Diverticulosis   . Hypercholesterolemia   . Hypertension   . Personal history of chemotherapy 2014   BREAST CA  . Personal history of radiation therapy 1996   BREAST CA    PAST SURGICAL HISTORY :   Past Surgical History:  Procedure Laterality Date  . ABDOMINAL HYSTERECTOMY    . COLONOSCOPY WITH PROPOFOL N/A 08/29/2015   Procedure: COLONOSCOPY WITH PROPOFOL;  [name], MD;  Location[facility] ENDOSCOPY;  Service: Gastroenterology;  [name];  . DILATION AND CURETTAGE, DIAGNOSTIC / THERAPEUTIC     . MASTECTOMY Right 1996   BREAST CA    FAMILY HISTORY :   Family History  Problem Relation Age of Onset  . Breast cancer Maternal Aunt   . Breast cancer Paternal Aunt   . Breast cancer Sister 61    SOCIAL HISTORY:   Social History   Tobacco Use  . Smoking status: Never Smoker  . Smokeless tobacco: Never Used  Substance Use Topics  . Alcohol use: No  . Drug use: No    ALLERGIES:  is allergic to lipitor [atorvastatin].  MEDICATIONS:  Current Outpatient Medications  Medication Sig Dispense Refill  . aspirin 81 MG tablet Take 81 mg by mouth daily.    . Calcium Carb-Cholecalciferol (CALCIUM 600+D3) 600-800 MG-UNIT TABS Take by mouth.    . Cholecalciferol (VITAMIN D3) 1000 UNITS CAPS Take by mouth.    . ferrous sulfate 325 (65 FE) MG tablet Take 325 mg by mouth daily with breakfast.     . glimepiride (AMARYL) 4 MG tablet TAKE ONE TABLET BY MOUTH TWICE DAILY WITH A MEAL    . glucose blood (BAYER CONTOUR NEXT TEST) test strip Use once daily. Use as instructed.    Marland Kitchen letrozole (FEMARA) 2.5 MG tablet TAKE 1 TABLET BY MOUTH EVERY DAY 90 tablet 3  . levothyroxine (SYNTHROID) 25 MCG tablet Take by mouth.    . losartan-hydrochlorothiazide (HYZAAR) 100-12.5 MG tablet Take 1 tablet by mouth daily.    . metFORMIN (GLUCOPHAGE) 1000 MG tablet Take 1,000 mg by mouth 2 (two) times daily with a meal.     . Multiple Vitamin (MULTI-VITAMINS) TABS Take by mouth.    Marland Kitchen NOVOLIN N 100 UNIT/ML injection Inject 20 Units into the skin daily before breakfast. And 10 units in the evening with dinner    . pravastatin (PRAVACHOL) 10 MG tablet Take 10 mg by mouth daily.     No current facility-administered medications for this visit.   Facility-Administered Medications Ordered in Other Visits  Medication Dose Route Frequency Provider Last Rate Last Admin  . heparin lock flush 100 unit/mL  500 Units Intravenous Once Charlaine Dalton R, MD      . heparin lock flush 100 unit/mL  500 Units Intravenous Once  Charlaine Dalton R, MD      . sodium chloride flush (NS) 0.9 % injection 10 mL  10 mL Intravenous Once Charlaine Dalton R, MD      . sodium chloride flush (NS) 0.9 % injection 10 mL  10 mL Intravenous Once Charlaine Dalton R, MD      . sodium chloride flush (NS) 0.9 % injection 10 mL  10 mL Intravenous PRN Cammie Sickle, MD        PHYSICAL EXAMINATION: ECOG PERFORMANCE STATUS: 0 - Asymptomatic  BP 139/75 (BP Location: Right Arm, Patient Position: Sitting, Cuff Size: Large)   Pulse 73   Temp 98.2 F (36.8 C) (Tympanic)   Resp 16   Ht 5' 2" (1.575 m)   Wt 221 lb 12.8 oz (100.6 kg)   SpO2 97%   BMI 40.57 kg/m   Filed Weights   06/05/20 1024  Weight: 221 lb 12.8 oz (100.6 kg)    Physical Exam HENT:  Head: Normocephalic and atraumatic.     Mouth/Throat:     Pharynx: No oropharyngeal exudate.  Eyes:     Pupils: Pupils are equal, round, and reactive to light.  Cardiovascular:     Rate and Rhythm: Normal rate and regular rhythm.  Pulmonary:     Effort: No respiratory distress.     Breath sounds: No wheezing.  Abdominal:     General: Bowel sounds are normal. There is no distension.     Palpations: Abdomen is soft. There is no mass.     Tenderness: There is no abdominal tenderness. There is no guarding or rebound.  Musculoskeletal:        General: No tenderness. Normal range of motion.     Cervical back: Normal range of motion and neck supple.     Comments: Left wrist is swollen tender.  Movement intact  Skin:    General: Skin is warm.     Comments: Right and left BREAST exam (in the presence of nurse)- no unusual skin changes or dominant masses felt. Surgical scars noted. Right mastectomy noted  Neurological:     Mental Status: She is alert and oriented to person, place, and time.  Psychiatric:        Mood and Affect: Affect normal.     .  LABORATORY DATA:  I have reviewed the data as listed    Component Value Date/Time   NA 138 06/05/2020 1012    NA 141 05/06/2014 1039   K 4.3 06/05/2020 1012   K 4.4 05/06/2014 1039   CL 102 06/05/2020 1012   CL 104 05/06/2014 1039   CO2 25 06/05/2020 1012   CO2 30 05/06/2014 1039   GLUCOSE 227 (H) 06/05/2020 1012   GLUCOSE 184 (H) 05/06/2014 1039   BUN 23 06/05/2020 1012   BUN 21 (H) 05/06/2014 1039   CREATININE 0.96 06/05/2020 1012   CREATININE 1.04 05/06/2014 1039   CALCIUM 9.7 06/05/2020 1012   CALCIUM 9.7 05/06/2014 1039   PROT 6.9 06/05/2020 1012   PROT 7.3 05/06/2014 1039   ALBUMIN 4.0 06/05/2020 1012   ALBUMIN 3.7 05/06/2014 1039   AST 30 06/05/2020 1012   AST 21 05/06/2014 1039   ALT 42 06/05/2020 1012   ALT 63 05/06/2014 1039   ALKPHOS 79 06/05/2020 1012   ALKPHOS 102 05/06/2014 1039   BILITOT 1.1 06/05/2020 1012   BILITOT 0.7 05/06/2014 1039   GFRNONAA 59 (L) 06/05/2020 1012   GFRNONAA 56 (L) 05/06/2014 1039   GFRAA >60 06/05/2020 1012   GFRAA >60 05/06/2014 1039    No results found for: SPEP, UPEP  Lab Results  Component Value Date   WBC 6.6 06/05/2020   NEUTROABS 3.3 06/05/2020   HGB 11.7 (L) 06/05/2020   HCT 37.5 06/05/2020   MCV 68.2 (L) 06/05/2020   PLT 336 06/05/2020      Chemistry      Component Value Date/Time   NA 138 06/05/2020 1012   NA 141 05/06/2014 1039   K 4.3 06/05/2020 1012   K 4.4 05/06/2014 1039   CL 102 06/05/2020 1012   CL 104 05/06/2014 1039   CO2 25 06/05/2020 1012   CO2 30 05/06/2014 1039   BUN 23 06/05/2020 1012   BUN 21 (H) 05/06/2014 1039   CREATININE 0.96 06/05/2020 1012   CREATININE 1.04 05/06/2014 1039      Component Value Date/Time   CALCIUM 9.7 06/05/2020 1012   CALCIUM 9.7 05/06/2014 1039   ALKPHOS 79 06/05/2020 1012  ALKPHOS 102 05/06/2014 1039   AST 30 06/05/2020 1012   AST 21 05/06/2014 1039   ALT 42 06/05/2020 1012   ALT 63 05/06/2014 1039   BILITOT 1.1 06/05/2020 1012   BILITOT 0.7 05/06/2014 1039      RADIOGRAPHIC STUDIES: I have personally reviewed the radiological images as listed and agreed  with the findings in the report. No results found.   ASSESSMENT & PLAN:  Carcinoma of overlapping sites of left breast in female, estrogen receptor positive ([place]) # RIGHT BREAST IPISILATERAL RECURRENCE- STAGE I Breast cancer ER/PR positive HER-2/neu negative- currently letrozole [since April 2014].    # Clinically no evidence of recurrence. Continue letrozole for now- extended  [until 2024 spring].  STABLE.   # Microcytic anemia- hemoglobin 11.7/suspect thalassemia.  Asymptomatic;STABLE.   # BMD- T score+-0.3; normal [FEB 2021];  Continue calcium plus vitamin D.   # DM- BG 223- fasting-poorly controlled recommend follow up with PCP.    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 CBC with Differential    Standing Status:   Future    Standing Expiration Date:   06/05/2021  . Comprehensive metabolic panel    Standing Status:   Future    Standing Expiration Date:   06/05/2021   All questions were answered. The patient knows to call the clinic with any problems, questions or concerns.      Cammie Sickle, MD 06/05/2020 12:51 PM

## 2020-11-04 ENCOUNTER — Other Ambulatory Visit: Payer: Self-pay

## 2020-11-04 ENCOUNTER — Ambulatory Visit
Admission: RE | Admit: 2020-11-04 | Discharge: 2020-11-04 | Disposition: A | Discharge status: Home / Self Care | Payer: Medicare Other | Source: Ambulatory Visit | Attending: Internal Medicine | Admitting: Internal Medicine

## 2020-11-04 DIAGNOSIS — Z170 Estrogen receptor positive status [ER+]: Secondary | ICD-10-CM | POA: Diagnosis not present

## 2020-11-04 DIAGNOSIS — Z853 Personal history of malignant neoplasm of breast: Secondary | ICD-10-CM | POA: Insufficient documentation

## 2020-11-04 DIAGNOSIS — C50812 Malignant neoplasm of overlapping sites of left female breast: Secondary | ICD-10-CM

## 2020-11-04 DIAGNOSIS — Z1231 Encounter for screening mammogram for malignant neoplasm of breast: Secondary | ICD-10-CM | POA: Insufficient documentation

## 2020-11-13 ENCOUNTER — Other Ambulatory Visit: Payer: Self-pay | Admitting: Internal Medicine

## 2020-12-04 ENCOUNTER — Other Ambulatory Visit: Payer: Self-pay

## 2020-12-04 ENCOUNTER — Encounter: Payer: Self-pay | Admitting: Internal Medicine

## 2020-12-04 ENCOUNTER — Inpatient Hospital Stay (HOSPITAL_BASED_OUTPATIENT_CLINIC_OR_DEPARTMENT_OTHER): Payer: Medicare Other | Admitting: Internal Medicine

## 2020-12-04 ENCOUNTER — Inpatient Hospital Stay: Payer: Medicare Other | Attending: Internal Medicine

## 2020-12-04 DIAGNOSIS — Z170 Estrogen receptor positive status [ER+]: Secondary | ICD-10-CM | POA: Insufficient documentation

## 2020-12-04 DIAGNOSIS — Z79811 Long term (current) use of aromatase inhibitors: Secondary | ICD-10-CM | POA: Insufficient documentation

## 2020-12-04 DIAGNOSIS — C50812 Malignant neoplasm of overlapping sites of left female breast: Secondary | ICD-10-CM | POA: Insufficient documentation

## 2020-12-04 DIAGNOSIS — Z803 Family history of malignant neoplasm of breast: Secondary | ICD-10-CM | POA: Insufficient documentation

## 2020-12-04 DIAGNOSIS — Z9011 Acquired absence of right breast and nipple: Secondary | ICD-10-CM | POA: Diagnosis not present

## 2020-12-04 DIAGNOSIS — D509 Iron deficiency anemia, unspecified: Secondary | ICD-10-CM | POA: Diagnosis not present

## 2020-12-04 DIAGNOSIS — Z90710 Acquired absence of both cervix and uterus: Secondary | ICD-10-CM | POA: Insufficient documentation

## 2020-12-04 DIAGNOSIS — E1165 Type 2 diabetes mellitus with hyperglycemia: Secondary | ICD-10-CM | POA: Diagnosis not present

## 2020-12-04 LAB — COMPREHENSIVE METABOLIC PANEL
ALT: 33 U/L (ref 0–44)
AST: 24 U/L (ref 15–41)
Albumin: 4.1 g/dL (ref 3.5–5.0)
Alkaline Phosphatase: 71 U/L (ref 38–126)
Anion gap: 11 (ref 5–15)
BUN: 25 mg/dL — ABNORMAL HIGH (ref 8–23)
CO2: 25 mmol/L (ref 22–32)
Calcium: 9.9 mg/dL (ref 8.9–10.3)
Chloride: 104 mmol/L (ref 98–111)
Creatinine, Ser: 0.98 mg/dL (ref 0.44–1.00)
GFR, Estimated: >60 mL/min (ref >60)
Glucose, Bld: 235 mg/dL — ABNORMAL HIGH (ref 70–99)
Potassium: 4.1 mmol/L (ref 3.5–5.1)
Sodium: 140 mmol/L (ref 135–145)
Total Bilirubin: 1 mg/dL (ref 0.3–1.2)
Total Protein: 7.2 g/dL (ref 6.5–8.1)

## 2020-12-04 LAB — CBC WITH DIFFERENTIAL/PLATELET
Abs Immature Granulocytes: 0.01 10*3/uL (ref 0.00–0.07)
Basophils Absolute: 0 10*3/uL (ref 0.0–0.1)
Basophils Relative: 1 %
Eosinophils Absolute: 0.1 10*3/uL (ref 0.0–0.5)
Eosinophils Relative: 1 %
HCT: 38 % (ref 36.0–46.0)
Hemoglobin: 11.8 g/dL — ABNORMAL LOW (ref 12.0–15.0)
Immature Granulocytes: 0 %
Lymphocytes Relative: 44 %
Lymphs Abs: 2.9 10*3/uL (ref 0.7–4.0)
MCH: 21.2 pg — ABNORMAL LOW (ref 26.0–34.0)
MCHC: 31.1 g/dL (ref 30.0–36.0)
MCV: 68.3 fL — ABNORMAL LOW (ref 80.0–100.0)
Monocytes Absolute: 0.4 10*3/uL (ref 0.1–1.0)
Monocytes Relative: 6 %
Neutro Abs: 3.2 10*3/uL (ref 1.7–7.7)
Neutrophils Relative %: 48 %
Platelets: 358 10*3/uL (ref 150–400)
RBC: 5.56 MIL/uL — ABNORMAL HIGH (ref 3.87–5.11)
RDW: 14.9 % (ref 11.5–15.5)
WBC: 6.5 10*3/uL (ref 4.0–10.5)
nRBC: 0 % (ref 0.0–0.2)

## 2020-12-04 NOTE — Assessment & Plan Note (Addendum)
#  RIGHT BREAST IPISILATERAL RECURRENCE- STAGE I Breast cancer ER/PR positive HER-2/neu negative- currently letrozole [since April 2014].    # Clinically no evidence of recurrence. Continue letrozole for now- extended  [until 2024 spring].  STABLE. mammo-Jan 2022- WNL.   # Microcytic anemia- hemoglobin 11.7/suspect thalassemia.  Asymptomatic;STABLE.   # BMD- T score+-0.3; normal [FEB 2021];  Continue calcium plus vitamin D.   # DM- BG 223- fasting-poorly controlled recommend follow up with PCP.    # Discoloration of left  3rd toe nail- defer to PCP re: dermatology  # DISPOSITION: # follow up in 6 months-MDlabs-cbc/cmp;Dr.B

## 2020-12-04 NOTE — Progress Notes (Signed)
[facility] OFFICE PROGRESS NOTE  Patient Care Team: Cammie Sickle, [name], [name], MD as Consulting Physician (Hematology and Oncology)  Cancer Staging No matching staging information was found for the patient.   Oncology History Overview Note  # 1996- Right breast. 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, [name], [name], [name]; [name], 2014) 7. Patient was started on Cytoxan and Taxotere January 11, 2013; Patient is on letrozole from April of 2014  # NOV 2016- BMD- Normal.  3. BRCA negative (verbal information from [place])  # mild anemia- ? Thalassemia- Hb ~11; [name]      ;  [name], estrogen receptor positive ([place])     INTERVAL HISTORY:  Nancy Gaines 75 y.o.  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  Patient continues  to have discoloration of her right fifth toe.  Denies any new lumps or bumps but no nausea vomiting.  States her blood sugars have been elevated.  Review of Systems  Constitutional: Negative for chills, diaphoresis, fever, malaise/fatigue and weight loss.  HENT: Negative for nosebleeds and sore throat.   Eyes: Negative for double vision.  Respiratory: Negative for cough, hemoptysis, sputum production, shortness of breath and wheezing.   Cardiovascular: Negative for chest pain, palpitations, orthopnea and leg swelling.  Gastrointestinal: Negative for abdominal pain, blood in stool, constipation, diarrhea, heartburn, melena, nausea and vomiting.  Genitourinary: Negative for dysuria, frequency and urgency.  Musculoskeletal: Positive for joint pain. Negative for back pain.  Skin: Negative.  Negative for itching and rash.  Neurological: Negative for dizziness, tingling, focal weakness, weakness and headaches.  Endo/Heme/Allergies: Does not bruise/bleed easily.  Psychiatric/Behavioral: Negative for depression. The patient is not nervous/anxious and does not have insomnia.      PAST MEDICAL HISTORY :  Past Medical History:  Diagnosis Date  . Anemia   . Breast cancer ([place]) 2014   breast- chemo  . Cancer [facility]) 1996   breast cancer- radiation  . Degenerative joint disease   . Dermatophytosis of nail   . Diabetes mellitus without complication ([place])   . Diverticulosis   . Hypercholesterolemia   . Hypertension   . Personal history of chemotherapy 2014   BREAST CA  . Personal history of radiation therapy 1996   BREAST CA    PAST SURGICAL HISTORY :   Past Surgical History:  Procedure Laterality Date  . ABDOMINAL  HYSTERECTOMY    . COLONOSCOPY WITH PROPOFOL N/A 08/29/2015   Procedure: COLONOSCOPY WITH PROPOFOL;  [name], MD;  Location: [facility] ENDOSCOPY;  Service: Gastroenterology;  [name];  . DILATION AND CURETTAGE, DIAGNOSTIC / THERAPEUTIC    . MASTECTOMY Right 1996    BREAST CA    FAMILY HISTORY :   Family History  Problem Relation Age of Onset  . Breast cancer Maternal Aunt   . Breast cancer Paternal Aunt   . Breast cancer Sister 46    SOCIAL HISTORY:   Social History   Tobacco Use  . Smoking status: Never Smoker  . Smokeless tobacco: Never Used  Substance Use Topics  . Alcohol use: No  . Drug use: No    ALLERGIES:  is allergic to lipitor [atorvastatin].  MEDICATIONS:  Current Outpatient Medications  Medication Sig Dispense Refill  . aspirin 81 MG tablet Take 81 mg by mouth daily.    . Calcium Carb-Cholecalciferol 600-800 MG-UNIT TABS Take by mouth.    . Cholecalciferol (VITAMIN D3) 1000 UNITS CAPS Take by mouth.    . ferrous sulfate 325 (65 FE) MG tablet Take 325 mg by mouth daily with breakfast.     . glimepiride (AMARYL) 4 MG tablet TAKE ONE TABLET BY MOUTH TWICE DAILY WITH A MEAL    . glucose blood test strip Use once daily. Use as instructed.    Marland Kitchen letrozole (FEMARA) 2.5 MG tablet TAKE 1 TABLET BY MOUTH EVERY DAY 90 tablet 3  . levothyroxine (SYNTHROID) 25 MCG tablet Take by mouth.    . losartan-hydrochlorothiazide (HYZAAR) 100-12.5 MG tablet Take 1 tablet by mouth daily.    . metFORMIN (GLUCOPHAGE) 1000 MG tablet Take 1,000 mg by mouth 2 (two) times daily with a meal.     . Multiple Vitamin (MULTI-VITAMINS) TABS Take by mouth.    Marland Kitchen NOVOLIN N 100 UNIT/ML injection Inject 20 Units into the skin daily before breakfast. And 10 units in the evening with dinner    . pravastatin (PRAVACHOL) 10 MG tablet Take 10 mg by mouth daily.     No current facility-administered medications for this visit.   Facility-Administered Medications Ordered in Other Visits  Medication Dose Route Frequency Provider Last Rate Last Admin  . heparin lock flush 100 unit/mL  500 Units Intravenous Once Charlaine Dalton R, MD      . heparin lock flush 100 unit/mL  500 Units Intravenous Once Charlaine Dalton R, MD      . sodium chloride flush (NS) 0.9 %  injection 10 mL  10 mL Intravenous Once Charlaine Dalton R, MD      . sodium chloride flush (NS) 0.9 % injection 10 mL  10 mL Intravenous Once Charlaine Dalton R, MD      . sodium chloride flush (NS) 0.9 % injection 10 mL  10 mL Intravenous PRN Cammie Sickle, MD        PHYSICAL EXAMINATION: ECOG PERFORMANCE STATUS: 0 - Asymptomatic  BP (!) 155/80 (BP Location: Left Arm, Patient Position: Sitting, Cuff Size: Normal)   Pulse 97   Temp (!) 97.2 F (36.2 C) (Tympanic)   Resp 16   Ht '5\' 2"'  (1.575 m)   Wt 221 lb (100.2 kg)   SpO2 99%   BMI 40.42 kg/m   Filed Weights   12/04/20 1049  Weight: 221 lb (100.2 kg)    Physical Exam HENT:     Head: Normocephalic and atraumatic.     Mouth/Throat:  Pharynx: No oropharyngeal exudate.  Eyes:     Pupils: Pupils are equal, round, and reactive to light.  Cardiovascular:     Rate and Rhythm: Normal rate and regular rhythm.  Pulmonary:     Effort: No respiratory distress.     Breath sounds: No wheezing.  Abdominal:     General: Bowel sounds are normal. There is no distension.     Palpations: Abdomen is soft. There is no mass.     Tenderness: There is no abdominal tenderness. There is no guarding or rebound.  Musculoskeletal:        General: No tenderness. Normal range of motion.     Cervical back: Normal range of motion and neck supple.     Comments: Left wrist is swollen tender.  Movement intact  Skin:    General: Skin is warm.     Comments: Discoloration of the left third toe/nail bed.  Neurological:     Mental Status: She is alert and oriented to person, place, and time.  Psychiatric:        Mood and Affect: Affect normal.     .  LABORATORY DATA:  I have reviewed the data as listed    Component Value Date/Time   NA 140 12/04/2020 1029   NA 141 05/06/2014 1039   K 4.1 12/04/2020 1029   K 4.4 05/06/2014 1039   CL 104 12/04/2020 1029   CL 104 05/06/2014 1039   CO2 25 12/04/2020 1029   CO2 30 05/06/2014  1039   GLUCOSE 235 (H) 12/04/2020 1029   GLUCOSE 184 (H) 05/06/2014 1039   BUN 25 (H) 12/04/2020 1029   BUN 21 (H) 05/06/2014 1039   CREATININE 0.98 12/04/2020 1029   CREATININE 1.04 05/06/2014 1039   CALCIUM 9.9 12/04/2020 1029   CALCIUM 9.7 05/06/2014 1039   PROT 7.2 12/04/2020 1029   PROT 7.3 05/06/2014 1039   ALBUMIN 4.1 12/04/2020 1029   ALBUMIN 3.7 05/06/2014 1039   AST 24 12/04/2020 1029   AST 21 05/06/2014 1039   ALT 33 12/04/2020 1029   ALT 63 05/06/2014 1039   ALKPHOS 71 12/04/2020 1029   ALKPHOS 102 05/06/2014 1039   BILITOT 1.0 12/04/2020 1029   BILITOT 0.7 05/06/2014 1039   GFRNONAA >60 12/04/2020 1029   GFRNONAA 56 (L) 05/06/2014 1039   GFRAA >60 06/05/2020 1012   GFRAA >60 05/06/2014 1039    No results found for: SPEP, UPEP  Lab Results  Component Value Date   WBC 6.5 12/04/2020   NEUTROABS 3.2 12/04/2020   HGB 11.8 (L) 12/04/2020   HCT 38.0 12/04/2020   MCV 68.3 (L) 12/04/2020   PLT 358 12/04/2020      Chemistry      Component Value Date/Time   NA 140 12/04/2020 1029   NA 141 05/06/2014 1039   K 4.1 12/04/2020 1029   K 4.4 05/06/2014 1039   CL 104 12/04/2020 1029   CL 104 05/06/2014 1039   CO2 25 12/04/2020 1029   CO2 30 05/06/2014 1039   BUN 25 (H) 12/04/2020 1029   BUN 21 (H) 05/06/2014 1039   CREATININE 0.98 12/04/2020 1029   CREATININE 1.04 05/06/2014 1039      Component Value Date/Time   CALCIUM 9.9 12/04/2020 1029   CALCIUM 9.7 05/06/2014 1039   ALKPHOS 71 12/04/2020 1029   ALKPHOS 102 05/06/2014 1039   AST 24 12/04/2020 1029   AST 21 05/06/2014 1039   ALT 33 12/04/2020 1029   ALT 63 05/06/2014  1039   BILITOT 1.0 12/04/2020 1029   BILITOT 0.7 05/06/2014 1039      RADIOGRAPHIC STUDIES: I have personally reviewed the radiological images as listed and agreed with the findings in the report. No results found.   ASSESSMENT & PLAN:  Carcinoma of overlapping sites of left breast in female, estrogen receptor positive ([place]) #  RIGHT BREAST IPISILATERAL RECURRENCE- STAGE I Breast cancer ER/PR positive HER-2/neu negative- currently letrozole [since April 2014].    # Clinically no evidence of recurrence. Continue letrozole for now- extended  [until 2024 spring].  STABLE. mammo-Jan 2022- WNL.   # Microcytic anemia- hemoglobin 11.7/suspect thalassemia.  Asymptomatic;STABLE.   # BMD- T score+-0.3; normal [FEB 2021];  Continue calcium plus vitamin D.   # DM- BG 223- fasting-poorly controlled recommend follow up with PCP.    # Discoloration of left  3rd toe nail- defer to PCP re: dermatology  # DISPOSITION: # follow up in 6 months-MDlabs-cbc/cmp;Dr.B    No orders of the defined types were placed in this encounter.  All questions were answered. The patient knows to call the clinic with any problems, questions or concerns.      Cammie Sickle, MD 12/04/2020 11:32 AM

## 2020-12-04 NOTE — Progress Notes (Signed)
Feels like balance is off at times. Has black spot on 3rd toe on left foot she would like checked.

## 2021-06-04 ENCOUNTER — Inpatient Hospital Stay: Payer: Medicare Other | Admitting: Internal Medicine

## 2021-06-04 ENCOUNTER — Inpatient Hospital Stay: Payer: Medicare Other

## 2021-06-11 ENCOUNTER — Other Ambulatory Visit: Payer: Self-pay | Admitting: *Deleted

## 2021-06-11 DIAGNOSIS — C50812 Malignant neoplasm of overlapping sites of left female breast: Secondary | ICD-10-CM

## 2021-06-11 DIAGNOSIS — Z170 Estrogen receptor positive status [ER+]: Secondary | ICD-10-CM

## 2021-06-11 DIAGNOSIS — D509 Iron deficiency anemia, unspecified: Secondary | ICD-10-CM

## 2021-06-12 ENCOUNTER — Inpatient Hospital Stay: Payer: Medicare Other | Attending: Internal Medicine

## 2021-06-12 ENCOUNTER — Other Ambulatory Visit: Payer: Self-pay

## 2021-06-12 ENCOUNTER — Ambulatory Visit: Payer: Medicare Other | Attending: Internal Medicine

## 2021-06-12 ENCOUNTER — Inpatient Hospital Stay (HOSPITAL_BASED_OUTPATIENT_CLINIC_OR_DEPARTMENT_OTHER): Payer: Medicare Other | Admitting: Internal Medicine

## 2021-06-12 VITALS — BP 141/80 | HR 83 | Temp 97.3°F | Resp 18

## 2021-06-12 DIAGNOSIS — Z79811 Long term (current) use of aromatase inhibitors: Secondary | ICD-10-CM | POA: Diagnosis not present

## 2021-06-12 DIAGNOSIS — Z170 Estrogen receptor positive status [ER+]: Secondary | ICD-10-CM | POA: Insufficient documentation

## 2021-06-12 DIAGNOSIS — Z23 Encounter for immunization: Secondary | ICD-10-CM

## 2021-06-12 DIAGNOSIS — E1165 Type 2 diabetes mellitus with hyperglycemia: Secondary | ICD-10-CM | POA: Diagnosis not present

## 2021-06-12 DIAGNOSIS — C50812 Malignant neoplasm of overlapping sites of left female breast: Secondary | ICD-10-CM

## 2021-06-12 DIAGNOSIS — D509 Iron deficiency anemia, unspecified: Secondary | ICD-10-CM | POA: Diagnosis not present

## 2021-06-12 DIAGNOSIS — Z1231 Encounter for screening mammogram for malignant neoplasm of breast: Secondary | ICD-10-CM | POA: Diagnosis not present

## 2021-06-12 DIAGNOSIS — Z9011 Acquired absence of right breast and nipple: Secondary | ICD-10-CM | POA: Insufficient documentation

## 2021-06-12 DIAGNOSIS — Z803 Family history of malignant neoplasm of breast: Secondary | ICD-10-CM | POA: Insufficient documentation

## 2021-06-12 DIAGNOSIS — Z90710 Acquired absence of both cervix and uterus: Secondary | ICD-10-CM | POA: Diagnosis not present

## 2021-06-12 LAB — CBC WITH DIFFERENTIAL/PLATELET
Abs Immature Granulocytes: 0.02 10*3/uL (ref 0.00–0.07)
Basophils Absolute: 0 10*3/uL (ref 0.0–0.1)
Basophils Relative: 1 %
Eosinophils Absolute: 0.1 10*3/uL (ref 0.0–0.5)
Eosinophils Relative: 2 %
HCT: 36.9 % (ref 36.0–46.0)
Hemoglobin: 11.4 g/dL — ABNORMAL LOW (ref 12.0–15.0)
Immature Granulocytes: 0 %
Lymphocytes Relative: 41 %
Lymphs Abs: 2.9 10*3/uL (ref 0.7–4.0)
MCH: 21.3 pg — ABNORMAL LOW (ref 26.0–34.0)
MCHC: 30.9 g/dL (ref 30.0–36.0)
MCV: 69.1 fL — ABNORMAL LOW (ref 80.0–100.0)
Monocytes Absolute: 0.5 10*3/uL (ref 0.1–1.0)
Monocytes Relative: 7 %
Neutro Abs: 3.5 10*3/uL (ref 1.7–7.7)
Neutrophils Relative %: 49 %
Platelets: 329 10*3/uL (ref 150–400)
RBC: 5.34 MIL/uL — ABNORMAL HIGH (ref 3.87–5.11)
RDW: 14.8 % (ref 11.5–15.5)
WBC: 7 10*3/uL (ref 4.0–10.5)
nRBC: 0 % (ref 0.0–0.2)

## 2021-06-12 LAB — COMPREHENSIVE METABOLIC PANEL
ALT: 33 U/L (ref 0–44)
AST: 22 U/L (ref 15–41)
Albumin: 3.9 g/dL (ref 3.5–5.0)
Alkaline Phosphatase: 75 U/L (ref 38–126)
Anion gap: 9 (ref 5–15)
BUN: 23 mg/dL (ref 8–23)
CO2: 26 mmol/L (ref 22–32)
Calcium: 9.4 mg/dL (ref 8.9–10.3)
Chloride: 103 mmol/L (ref 98–111)
Creatinine, Ser: 0.96 mg/dL (ref 0.44–1.00)
GFR, Estimated: >60 mL/min (ref >60)
Glucose, Bld: 221 mg/dL — ABNORMAL HIGH (ref 70–99)
Potassium: 4.2 mmol/L (ref 3.5–5.1)
Sodium: 138 mmol/L (ref 135–145)
Total Bilirubin: 1 mg/dL (ref 0.3–1.2)
Total Protein: 6.9 g/dL (ref 6.5–8.1)

## 2021-06-12 MED ORDER — PFIZER-BIONT COVID-19 VAC-TRIS 30 MCG/0.3ML IM SUSP
INTRAMUSCULAR | 0 refills | Status: DC
  Filled 2021-06-12: qty 0.3, 1d supply, fill #0

## 2021-06-12 NOTE — Assessment & Plan Note (Addendum)
#  RIGHT BREAST IPISILATERAL RECURRENCE- STAGE I Breast cancer ER/PR positive HER-2/neu negative- currently on Ext.AI- letrozole [since April 2014].  STABLE.    # Clinically no evidence of recurrence. Continue letrozole for now- extended  [until 2024 spring].  STABLE. mammo-Jan 2022- WNL.   # Microcytic anemia- hemoglobin 10-11-suspect thalassemia.  Asymptomatic; STABLE>   # BMD- T score+-0.3; normal [FEB 2021];  Continue calcium plus vitamin D.   # DM- BG 221- fasting; HbA1C- 9.4-poorly controlled recommend follow up with PCP.   Discussed importance of healthy weight/and weight loss in management of diabetes.  Strongly recommend eating more green leafy vegetables and cutting down processed food/ carbohydrates.  Instead increasing whole grains / protein in the diet.  Multiple studies have shown that optimal weight would help improve cardiovascular risk; also shown to cut on the risk of malignancies-colon cancer, breast cancer ovarian/uterine cancer in women and also prostate cancer in men.patient states that she is aware of what she is supposed not to eat; however she will try to improve her dietary habits.   # DISPOSITION: # follow up in 6 months-MDlabs-cbc/cmp;Dr.B

## 2021-06-12 NOTE — Progress Notes (Signed)
[name], [name], [name], MD as Consulting Physician (Hematology and Oncology)  Cancer Staging No matching staging information was found for the patient.   Oncology History Overview Note  # 1996- Right breast. 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, [name], [name], [name]; [name], 2014) 7. Patient was started on Cytoxan and Taxotere January 11, 2013; [name]; [name]      ;  [name], 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, diaphoresis, fever, [name], hemoptysis, sputum production, [name], palpitations, orthopnea and leg swelling.  Gastrointestinal:  Negative for abdominal pain, blood in stool, constipation, diarrhea, heartburn, melena, nausea and vomiting.  Genitourinary:  Negative for dysuria, frequency and urgency.  Musculoskeletal:  Positive for joint pain. Negative for back pain.  Skin: Negative.  Negative for itching and rash.  Neurological:  Negative for dizziness, tingling, focal weakness, weakness and headaches.  Endo/Heme/Allergies:  Does not bruise/bleed easily.  Psychiatric/Behavioral:  Negative for depression. The patient is not nervous/anxious and does not have insomnia.     PAST MEDICAL HISTORY :  Past Medical History:  Diagnosis Date   Anemia    Breast cancer ([place]) 2014   breast- chemo   Cancer ([place]) 1996   breast cancer- radiation   Degenerative joint disease    Dermatophytosis of nail    Diabetes mellitus without complication ([place])    Diverticulosis    Hypercholesterolemia    Hypertension    Personal history of chemotherapy 2014   BREAST CA   Personal history of radiation therapy 1996   BREAST CA    PAST SURGICAL HISTORY :  Past Surgical History:  Procedure Laterality Date   ABDOMINAL HYSTERECTOMY     COLONOSCOPY WITH PROPOFOL N/A 08/29/2015   Procedure: COLONOSCOPY WITH PROPOFOL;  [name], MD;  [name];  Service: Gastroenterology;  [name];   DILATION AND CURETTAGE, [name],[name], [name], [name], [name], [name], [name], [name] mL Intravenous PRN Cammie Sickle, MD        PHYSICAL EXAMINATION: ECOG PERFORMANCE STATUS: 0 - Asymptomatic  BP (!) 141/80   Pulse 83   Temp (!) 97.3 F (36.3 C) (Tympanic)   Resp 18   There were no vitals filed for this visit.   Physical Exam HENT:     Head: Normocephalic and atraumatic.     Mouth/Throat:     Pharynx: No oropharyngeal exudate.  Eyes:     Pupils: Pupils are equal, round, and reactive to light.  Cardiovascular:  Rate and Rhythm: Normal rate and regular rhythm.  Pulmonary:     Effort: No respiratory distress.     Breath sounds: No wheezing.  Abdominal:     General: Bowel sounds are normal. There is no distension.     Palpations: Abdomen is soft. There is no mass.     Tenderness: There is no abdominal tenderness. There is no guarding or rebound.  Musculoskeletal:        General: No tenderness. Normal range of motion.     Cervical back: Normal range of motion and neck supple.     Comments: Left wrist is swollen tender.  Movement intact  Skin:    General: Skin is warm.     Comments: Discoloration of the left third toe/nail bed.  Neurological:     Mental Status: She is alert and oriented to person, place, and time.  Psychiatric:        Mood and Affect: Affect normal.    .  LABORATORY DATA:  I have reviewed the data as listed    Component Value Date/Time   NA 138 06/12/2021 0923   NA 141 05/06/2014 1039   K 4.2 06/12/2021 0923   K 4.4 05/06/2014 1039   CL 103 06/12/2021 0923   CL 104 05/06/2014 1039   CO2 26 06/12/2021 0923   CO2 30 05/06/2014 1039   GLUCOSE 221 (H) 06/12/2021 0923   GLUCOSE 184 (H) 05/06/2014 1039   BUN 23 06/12/2021 0923   BUN 21 (H) 05/06/2014 1039    CREATININE 0.96 06/12/2021 0923   CREATININE 1.04 05/06/2014 1039   CALCIUM 9.4 06/12/2021 0923   CALCIUM 9.7 05/06/2014 1039   PROT 6.9 06/12/2021 0923   PROT 7.3 05/06/2014 1039   ALBUMIN 3.9 06/12/2021 0923   ALBUMIN 3.7 05/06/2014 1039   AST 22 06/12/2021 0923   AST 21 05/06/2014 1039   ALT 33 06/12/2021 0923   ALT 63 05/06/2014 1039   ALKPHOS 75 06/12/2021 0923   ALKPHOS 102 05/06/2014 1039   BILITOT 1.0 06/12/2021 0923   BILITOT 0.7 05/06/2014 1039   GFRNONAA >60 06/12/2021 0923   GFRNONAA 56 (L) 05/06/2014 1039   GFRAA >60 06/05/2020 1012   GFRAA >60 05/06/2014 1039    No results found for: SPEP, UPEP  Lab Results  Component Value Date   WBC 7.0 06/12/2021   NEUTROABS 3.5 06/12/2021   HGB 11.4 (L) 06/12/2021   HCT 36.9 06/12/2021   MCV 69.1 (L) 06/12/2021   PLT 329 06/12/2021      Chemistry      Component Value Date/Time   NA 138 06/12/2021 0923   NA 141 05/06/2014 1039   K 4.2 06/12/2021 0923   K 4.4 05/06/2014 1039   CL 103 06/12/2021 0923   CL 104 05/06/2014 1039   CO2 26 06/12/2021 0923   CO2 30 05/06/2014 1039   BUN 23 06/12/2021 0923   BUN 21 (H) 05/06/2014 1039   CREATININE 0.96 06/12/2021 0923   CREATININE 1.04 05/06/2014 1039      Component Value Date/Time   CALCIUM 9.4 06/12/2021 0923   CALCIUM 9.7 05/06/2014 1039   ALKPHOS 75 06/12/2021 0923   ALKPHOS 102 05/06/2014 1039   AST 22 06/12/2021 0923   AST 21 05/06/2014 1039   ALT 33 06/12/2021 0923   ALT 63 05/06/2014 1039   BILITOT 1.0 06/12/2021 0923   BILITOT 0.7 05/06/2014 1039      RADIOGRAPHIC STUDIES: I have personally reviewed the radiological images as  listed and agreed with the findings in the report. No results found.   ASSESSMENT & PLAN:  Carcinoma of overlapping sites of left breast in female, estrogen receptor positive ([place]) # RIGHT BREAST IPISILATERAL RECURRENCE- STAGE I Breast cancer ER/PR positive HER-2/neu negative- currently on Ext.AI- letrozole [since April 2014].   STABLE.    # Clinically no evidence of recurrence. Continue letrozole for now- extended  [until 2024 spring].  STABLE. mammo-Jan 2022- WNL.   # Microcytic anemia- hemoglobin 10-11-suspect thalassemia.  Asymptomatic; STABLE>   # BMD- T score+-0.3; normal [FEB 2021];  Continue calcium plus vitamin D.   # DM- BG 221- fasting; HbA1C- 9.4-poorly controlled recommend follow up with PCP.   Discussed importance of healthy weight/and weight loss in management of diabetes.  Strongly recommend eating more green leafy vegetables and cutting down processed food/ carbohydrates.  Instead increasing whole grains / protein in the diet.  Multiple studies have shown that optimal weight would help improve cardiovascular risk; also shown to cut on the risk of malignancies-colon cancer, breast cancer ovarian/uterine cancer in women and also prostate cancer in men.patient states that she is aware of what she is supposed not to eat; however she will try to improve her dietary habits.   # DISPOSITION: # follow up in 6 months-[facility]abs-cbc/cmp;Dr.B   Orders Placed This Encounter  Procedures   MM 3D SCREEN BREAST UNI LEFT    Standing Status:   Future    Standing Expiration Date:   06/12/2022    Order Specific Question:   Reason for Exam (SYMPTOM  OR DIAGNOSIS REQUIRED)    Answer:   history of breast cancer; yearly surveillance    Order Specific Question:   Preferred imaging location?    Answer:   [facility]    All questions were answered. The patient knows to call the clinic with any problems, questions or concerns.      Cammie Sickle, MD 06/12/2021 2:18 PM

## 2021-06-12 NOTE — Progress Notes (Signed)
   Covid-19 Vaccination Clinic  Name:  Nancy Gaines    MRN: [id number] DOB: [birth year]  06/12/2021  Ms. Nancy Gaines was observed post Covid-19 immunization for 15 minutes without incident. She was provided with Vaccine Information Sheet and instruction to access the V-Safe system.   Ms. Nancy Gaines was instructed to call 911 with any severe reactions post vaccine: Difficulty breathing  Swelling of face and throat  A fast heartbeat  A bad rash all over body  Dizziness and weakness   Immunizations Administered     Name Date Dose VIS Date Route   [name] Comrnaty(Gray TOP) Covid-19 Vaccine 06/12/2021 10:50 AM 0.3 mL 10/09/2020 Intramuscular   Manufacturer: [employer]   Lot: [id number]   [place]: [phone]

## 2021-06-12 NOTE — Progress Notes (Signed)
RN Chaperoned provider with Breast Exam.   

## 2021-06-12 NOTE — Patient Instructions (Addendum)
Since you have lost your covid vaccine card, you can call the [facility] team to obtain another copy of the card at call [phone] or see if the [facility] can assist you.

## 2021-10-12 DIAGNOSIS — E039 Hypothyroidism, unspecified: Secondary | ICD-10-CM | POA: Insufficient documentation

## 2021-11-14 ENCOUNTER — Other Ambulatory Visit: Payer: Self-pay | Admitting: Internal Medicine

## 2021-11-18 ENCOUNTER — Other Ambulatory Visit: Payer: Self-pay | Admitting: Internal Medicine

## 2021-11-19 ENCOUNTER — Other Ambulatory Visit: Payer: Self-pay | Admitting: *Deleted

## 2021-11-19 MED ORDER — LETROZOLE 2.5 MG PO TABS: 2.5000 mg | ORAL_TABLET | Freq: Every day | ORAL | 3 refills | Status: DC

## 2021-12-03 ENCOUNTER — Other Ambulatory Visit: Payer: Self-pay | Admitting: *Deleted

## 2021-12-03 DIAGNOSIS — Z170 Estrogen receptor positive status [ER+]: Secondary | ICD-10-CM

## 2021-12-03 DIAGNOSIS — C50812 Malignant neoplasm of overlapping sites of left female breast: Secondary | ICD-10-CM

## 2021-12-11 ENCOUNTER — Other Ambulatory Visit: Payer: Self-pay

## 2021-12-11 ENCOUNTER — Encounter: Payer: Self-pay | Admitting: Internal Medicine

## 2021-12-11 ENCOUNTER — Inpatient Hospital Stay: Payer: Medicare Other | Attending: Internal Medicine

## 2021-12-11 ENCOUNTER — Inpatient Hospital Stay (HOSPITAL_BASED_OUTPATIENT_CLINIC_OR_DEPARTMENT_OTHER): Payer: Medicare Other | Admitting: Internal Medicine

## 2021-12-11 VITALS — BP 128/72 | HR 110 | Temp 97.5°F | Ht 62.0 in | Wt 225.2 lb

## 2021-12-11 DIAGNOSIS — Z79899 Other long term (current) drug therapy: Secondary | ICD-10-CM | POA: Diagnosis not present

## 2021-12-11 DIAGNOSIS — Z79811 Long term (current) use of aromatase inhibitors: Secondary | ICD-10-CM | POA: Insufficient documentation

## 2021-12-11 DIAGNOSIS — M2550 Pain in unspecified joint: Secondary | ICD-10-CM | POA: Diagnosis not present

## 2021-12-11 DIAGNOSIS — E119 Type 2 diabetes mellitus without complications: Secondary | ICD-10-CM | POA: Diagnosis not present

## 2021-12-11 DIAGNOSIS — Z170 Estrogen receptor positive status [ER+]: Secondary | ICD-10-CM

## 2021-12-11 DIAGNOSIS — Z7984 Long term (current) use of oral hypoglycemic drugs: Secondary | ICD-10-CM | POA: Insufficient documentation

## 2021-12-11 DIAGNOSIS — Z803 Family history of malignant neoplasm of breast: Secondary | ICD-10-CM | POA: Insufficient documentation

## 2021-12-11 DIAGNOSIS — D509 Iron deficiency anemia, unspecified: Secondary | ICD-10-CM | POA: Diagnosis not present

## 2021-12-11 DIAGNOSIS — C50812 Malignant neoplasm of overlapping sites of left female breast: Secondary | ICD-10-CM

## 2021-12-11 LAB — CBC WITH DIFFERENTIAL/PLATELET
Abs Immature Granulocytes: 0.02 10*3/uL (ref 0.00–0.07)
Basophils Absolute: 0 10*3/uL (ref 0.0–0.1)
Basophils Relative: 1 %
Eosinophils Absolute: 0.2 10*3/uL (ref 0.0–0.5)
Eosinophils Relative: 2 %
HCT: 40.2 % (ref 36.0–46.0)
Hemoglobin: 12 g/dL (ref 12.0–15.0)
Immature Granulocytes: 0 %
Lymphocytes Relative: 47 %
Lymphs Abs: 3.7 10*3/uL (ref 0.7–4.0)
MCH: 20.7 pg — ABNORMAL LOW (ref 26.0–34.0)
MCHC: 29.9 g/dL — ABNORMAL LOW (ref 30.0–36.0)
MCV: 69.3 fL — ABNORMAL LOW (ref 80.0–100.0)
Monocytes Absolute: 0.6 10*3/uL (ref 0.1–1.0)
Monocytes Relative: 7 %
Neutro Abs: 3.4 10*3/uL (ref 1.7–7.7)
Neutrophils Relative %: 43 %
Platelets: 366 10*3/uL (ref 150–400)
RBC: 5.8 MIL/uL — ABNORMAL HIGH (ref 3.87–5.11)
RDW: 15.3 % (ref 11.5–15.5)
WBC: 7.8 10*3/uL (ref 4.0–10.5)
nRBC: 0 % (ref 0.0–0.2)

## 2021-12-11 LAB — COMPREHENSIVE METABOLIC PANEL
ALT: 39 U/L (ref 0–44)
AST: 26 U/L (ref 15–41)
Albumin: 4 g/dL (ref 3.5–5.0)
Alkaline Phosphatase: 88 U/L (ref 38–126)
Anion gap: 14 (ref 5–15)
BUN: 23 mg/dL (ref 8–23)
CO2: 26 mmol/L (ref 22–32)
Calcium: 10.1 mg/dL (ref 8.9–10.3)
Chloride: 100 mmol/L (ref 98–111)
Creatinine, Ser: 1.04 mg/dL — ABNORMAL HIGH (ref 0.44–1.00)
GFR, Estimated: 57 mL/min — ABNORMAL LOW (ref >60)
Glucose, Bld: 221 mg/dL — ABNORMAL HIGH (ref 70–99)
Potassium: 4.4 mmol/L (ref 3.5–5.1)
Sodium: 140 mmol/L (ref 135–145)
Total Bilirubin: 0.6 mg/dL (ref 0.3–1.2)
Total Protein: 7.3 g/dL (ref 6.5–8.1)

## 2021-12-11 NOTE — Assessment & Plan Note (Addendum)
#  RIGHT BREAST IPISILATERAL RECURRENCE- STAGE I Breast cancer ER/PR positive HER-2/neu negative- currently on Ext.AI- letrozole [since April 2014].  STABLE.    # Clinically no evidence of recurrence. Continue letrozole for now- extended  [until 2024 spring].  STABLE. mammo-FEB 2023-Pending.    # Microcytic anemia- hemoglobin 10-11-suspect thalassemia.  Asymptomatic; STABLE>   # BMD- T score+-0.3; normal [FEB 2021];  Continue calcium plus vitamin D; order BMD today with mammo.   # DM- BG 221- fasting; HbA1C- 9.2 -poorly controlled recommend follow up with PCP/ recommend weight loss. Recommend cutting down- soda/processed food. Patient states that she is aware of what she is supposed not to eat; however she will try to improve her dietary habits. Recommend referral to/diabetes medicine   # Memory issues: Discussed the importance of weight loss/diabetes as cause related to memory loss.  Patient's mother had Alzheimer's.  Recommend follow-up with PCP  # DISPOSITION: # schedule BMD with mammogram- Feb 15th, 2023 # follow up in 6 months-[facility]labs-cbc/cmp-;Dr.B

## 2021-12-11 NOTE — Progress Notes (Signed)
[facility] OFFICE PROGRESS NOTE  Patient Care Team: Cammie Sickle, [name], [name], MD as Consulting Physician (Hematology and Oncology)   Cancer Staging  No matching staging information was found for the patient.   Oncology History Overview Note  # 1996- Right breast. 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, [name], [name], [name]; [name], 2014) 7. Patient was started on Cytoxan and Taxotere January 11, 2013; Patient is on letrozole from April of 2014  # NOV 2016- BMD- Normal.  3. BRCA negative (verbal information from [place])  # mild anemia- ? Thalassemia- Hb ~11; [name]      ;  [name], estrogen receptor positive ([place])     INTERVAL HISTORY: Ambulating independently.  Alone.  Nancy Gaines 76 y.o.  female pleasant patient above history of Breast cancer ER/PR positive HER-2/neu negative stage I- currently on aromatase inhibitor is here  for follow-up.  Patient stated that she is still struggling to get her diabetes under control.  Admits to lack of exercise/poor dietary discretion.  Patient denies any unusual lumps or bumps.  No nausea no vomiting.  No worsening back pain.  Also complains of memory issues intermittently.   Review of Systems  Constitutional:  Negative for chills, diaphoresis, fever, [name], hemoptysis, sputum production, [name], palpitations, orthopnea and leg swelling.  Gastrointestinal:  Negative for abdominal pain, blood in stool, constipation, diarrhea, heartburn, melena, nausea and vomiting.  Genitourinary:  Negative for dysuria, frequency and urgency.  Musculoskeletal:  Positive for joint pain. Negative for back pain.  Skin: Negative.  Negative for itching and rash.  Neurological:  Negative for dizziness, tingling, focal weakness, weakness and headaches.  Endo/Heme/Allergies:  Does not bruise/bleed easily.  Psychiatric/Behavioral:  Negative for depression. The patient is not nervous/anxious and does not have insomnia.     PAST MEDICAL HISTORY :  Past Medical History:  Diagnosis Date   Anemia    Breast cancer ([place]) 2014   breast- chemo   Cancer ([place]) 1996   breast cancer- radiation   Degenerative joint disease    Dermatophytosis of nail    Diabetes mellitus without complication ([place])    Diverticulosis    Hypercholesterolemia    Hypertension    Personal history of chemotherapy 2014  BREAST CA   Personal history of radiation therapy 1996   BREAST CA    PAST SURGICAL HISTORY :   Past Surgical History:  Procedure Laterality Date   ABDOMINAL HYSTERECTOMY     COLONOSCOPY WITH PROPOFOL N/A 08/29/2015   Procedure: COLONOSCOPY WITH PROPOFOL;  [name], MD;  Location:  [facility] ENDOSCOPY;  Service: Gastroenterology;  [name];   DILATION AND CURETTAGE, DIAGNOSTIC / THERAPEUTIC     MASTECTOMY Right 1996   BREAST CA    FAMILY HISTORY :   Family History  Problem Relation Age of Onset   Breast cancer Maternal Aunt    Breast cancer Paternal Aunt    Breast cancer Sister 33    SOCIAL HISTORY:   Social History   Tobacco Use   Smoking status: Never   Smokeless tobacco: Never  Vaping Use   Vaping Use: Never used  Substance Use Topics   Alcohol use: No   Drug use: No    ALLERGIES:  is allergic to lipitor [atorvastatin].  MEDICATIONS:  Current Outpatient Medications  Medication Sig Dispense Refill   aspirin 81 MG tablet Take 81 mg by mouth daily.     Cholecalciferol (VITAMIN D3) 1000 UNITS CAPS Take by mouth.     COVID-19 mRNA Vac-TriS, [name], ([name]-BIONT COVID-19 VAC-TRIS) SUSP injection Inject into the muscle. 0.3 mL 0   ferrous sulfate 325 (65 FE) MG tablet Take 325 mg by mouth daily with breakfast.      glimepiride (AMARYL) 2 MG tablet Take 2 mg by mouth daily with breakfast.     glucose blood test strip Use once daily. Use as instructed.     letrozole (FEMARA) 2.5 MG tablet Take 1 tablet (2.5 mg total) by mouth daily. 90 tablet 3   losartan-hydrochlorothiazide (HYZAAR) 100-12.5 MG tablet Take 1 tablet by mouth daily.     metFORMIN (GLUCOPHAGE) 1000 MG tablet Take 1,000 mg by mouth 2 (two) times daily with a meal.      Multiple Vitamin (MULTI-VITAMINS) TABS Take by mouth.     NOVOLIN N 100 UNIT/ML injection Inject 35 Units into the skin daily before breakfast. And 20 units in the evening with dinner     pravastatin (PRAVACHOL) 20 MG tablet Take 1 tablet by mouth at bedtime.     levothyroxine (SYNTHROID) 25 MCG tablet Take by mouth.     No current facility-administered medications for this visit.   Facility-Administered Medications Ordered in Other Visits  Medication Dose Route Frequency Provider Last Rate Last Admin   heparin lock  flush 100 unit/mL  500 Units Intravenous Once Charlaine Dalton R, [name], [name], [name], [name] mL Intravenous PRN Cammie Sickle, MD        PHYSICAL EXAMINATION: ECOG PERFORMANCE STATUS: 0 - Asymptomatic  BP 128/72 (BP Location: Left Arm, Patient Position: Sitting, Cuff Size: Large)    Pulse (!) 110    Temp (!) 97.5 F (36.4 C) (Tympanic)    Ht 5' 2" (1.575 m)    Wt 225 lb 3.2  oz (102.2 kg)    SpO2 99%    BMI 41.19 kg/m   Filed Weights   12/11/21 1010  Weight: 225 lb 3.2 oz (102.2 kg)     Physical Exam HENT:     Head: Normocephalic and atraumatic.     Mouth/Throat:     Pharynx: No oropharyngeal exudate.  Eyes:     Pupils: Pupils are equal, round, and reactive to light.  Cardiovascular:     Rate and Rhythm: Normal rate and regular rhythm.  Pulmonary:     Effort: No respiratory distress.     Breath sounds: No wheezing.  Abdominal:     General: Bowel sounds are normal. There is no distension.     Palpations: Abdomen is soft. There is no mass.     Tenderness: There is no abdominal tenderness. There is no guarding or rebound.  Musculoskeletal:        General: No tenderness. Normal range of motion.     Cervical back: Normal range of motion and neck supple.     Comments: Left wrist is swollen tender.  Movement intact  Skin:    General: Skin is warm.     Comments: Discoloration of the left third toe/nail bed.  Neurological:     Mental Status: She is alert and oriented to person, place, and time.  Psychiatric:        Mood and Affect: Affect normal.    .  LABORATORY DATA:  I have reviewed the data as listed    Component Value Date/Time    NA 140 12/11/2021 0858   NA 141 05/06/2014 1039   K 4.4 12/11/2021 0858   K 4.4 05/06/2014 1039   CL 100 12/11/2021 0858   CL 104 05/06/2014 1039   CO2 26 12/11/2021 0858   CO2 30 05/06/2014 1039   GLUCOSE 221 (H) 12/11/2021 0858   GLUCOSE 184 (H) 05/06/2014 1039   BUN 23 12/11/2021 0858   BUN 21 (H) 05/06/2014 1039   CREATININE 1.04 (H) 12/11/2021 0858   CREATININE 1.04 05/06/2014 1039   CALCIUM 10.1 12/11/2021 0858   CALCIUM 9.7 05/06/2014 1039   PROT 7.3 12/11/2021 0858   PROT 7.3 05/06/2014 1039   ALBUMIN 4.0 12/11/2021 0858   ALBUMIN 3.7 05/06/2014 1039   AST 26 12/11/2021 0858   AST 21 05/06/2014 1039   ALT 39 12/11/2021 0858   ALT 63 05/06/2014 1039   ALKPHOS 88 12/11/2021 0858   ALKPHOS 102 05/06/2014 1039   BILITOT 0.6 12/11/2021 0858   BILITOT 0.7 05/06/2014 1039   GFRNONAA 57 (L) 12/11/2021 0858   GFRNONAA 56 (L) 05/06/2014 1039   GFRAA >60 06/05/2020 1012   GFRAA >60 05/06/2014 1039    No results found for: SPEP, UPEP  Lab Results  Component Value Date   WBC 7.8 12/11/2021   NEUTROABS 3.4 12/11/2021   HGB 12.0 12/11/2021   HCT 40.2 12/11/2021   MCV 69.3 (L) 12/11/2021   PLT 366 12/11/2021      Chemistry      Component Value Date/Time   NA 140 12/11/2021 0858   NA 141 05/06/2014 1039   K 4.4 12/11/2021 0858   K 4.4 05/06/2014 1039   CL 100 12/11/2021 0858   CL 104 05/06/2014 1039   CO2 26 12/11/2021 0858   CO2 30 05/06/2014 1039   BUN 23 12/11/2021 0858   BUN 21 (H) 05/06/2014 1039   CREATININE 1.04 (H) 12/11/2021 0858   CREATININE 1.04 05/06/2014 1039  Component Value Date/Time   CALCIUM 10.1 12/11/2021 0858   CALCIUM 9.7 05/06/2014 1039   ALKPHOS 88 12/11/2021 0858   ALKPHOS 102 05/06/2014 1039   AST 26 12/11/2021 0858   AST 21 05/06/2014 1039   ALT 39 12/11/2021 0858   ALT 63 05/06/2014 1039   BILITOT 0.6 12/11/2021 0858   BILITOT 0.7 05/06/2014 1039      RADIOGRAPHIC STUDIES: I have personally reviewed the radiological  images as listed and agreed with the findings in the report. No results found.   ASSESSMENT & PLAN:  Carcinoma of overlapping sites of left breast in female, estrogen receptor positive ([place]) # RIGHT BREAST IPISILATERAL RECURRENCE- STAGE I Breast cancer ER/PR positive HER-2/neu negative- currently on Ext.AI- letrozole [since April 2014].  STABLE.    # Clinically no evidence of recurrence. Continue letrozole for now- extended  [until 2024 spring].  STABLE. mammo-FEB 2023-Pending.    # Microcytic anemia- hemoglobin 10-11-suspect thalassemia.  Asymptomatic; STABLE>   # BMD- T score+-0.3; normal [FEB 2021];  Continue calcium plus vitamin D; order BMD today with mammo.   # DM- BG 221- fasting; HbA1C- 9.2 -poorly controlled recommend follow up with PCP/ recommend weight loss. Recommend cutting down- soda/processed food. Patient states that she is aware of what she is supposed not to eat; however she will try to improve her dietary habits. Recommend referral to/diabetes medicine   # Memory issues: Discussed the importance of weight loss/diabetes as cause related to memory loss.  Patient's mother had Alzheimer's.  Recommend follow-up with PCP  # DISPOSITION: # schedule BMD with mammogram- Feb 15th, 2023 # follow up in 6 months-[facility]/cmp-;Dr.B    Orders Placed This Encounter  Procedures   DG Bone Density    Standing Status:   Future    Standing Expiration Date:   12/11/2022    Order Specific Question:   Reason for Exam (SYMPTOM  OR DIAGNOSIS REQUIRED)    Answer:   hx breast cancer/aromatase inhititor use    Order Specific Question:   Preferred imaging location?    Answer:   [facility]   CBC with Differential/Platelet    Standing Status:   Future    Standing Expiration Date:   12/11/2022   Comprehensive metabolic panel    Standing Status:   Future    Standing Expiration Date:   12/11/2022    All questions were answered. The patient knows to call the clinic with any problems,  questions or concerns.      Cammie Sickle, MD 12/11/2021 1:39 PM

## 2021-12-16 ENCOUNTER — Ambulatory Visit
Admission: RE | Admit: 2021-12-16 | Discharge: 2021-12-16 | Disposition: A | Discharge status: Home / Self Care | Payer: Medicare Other | Source: Ambulatory Visit | Attending: Internal Medicine | Admitting: Internal Medicine

## 2021-12-16 ENCOUNTER — Other Ambulatory Visit: Payer: Self-pay

## 2021-12-16 DIAGNOSIS — Z1231 Encounter for screening mammogram for malignant neoplasm of breast: Secondary | ICD-10-CM | POA: Insufficient documentation

## 2022-02-26 ENCOUNTER — Telehealth: Payer: Self-pay

## 2022-02-26 NOTE — Telephone Encounter (Signed)
Pt states for the last few days she has noticed a pain/sting in the right breast area were her scar is. Most notable when pressing on it. Also, states she can feel a little something under there. She is concerned because this is new. She would like to know if you need to evaluate in office? ?

## 2022-02-26 NOTE — Telephone Encounter (Signed)
Called patient's home and mobile numbers and did not get patient.  Left message on her home voicemail because her mobile voicemail was not set up.  Offered patient appointment in [facility] on Monday morning and asked her to call back at the [facility] number to [facility] clinic area or if after hours to call on Monday Mar 01, 2022 to set up appointment.  ?

## 2022-03-01 NOTE — Telephone Encounter (Signed)
Spoke to patient today and she is agreeable to [facility]. Appointment scheduled.  ?

## 2022-03-03 ENCOUNTER — Other Ambulatory Visit: Payer: Self-pay | Admitting: *Deleted

## 2022-03-03 ENCOUNTER — Encounter: Payer: Self-pay | Admitting: Medical Oncology

## 2022-03-03 ENCOUNTER — Inpatient Hospital Stay: Payer: Medicare Other | Attending: Internal Medicine | Admitting: Medical Oncology

## 2022-03-03 VITALS — BP 136/74 | HR 88 | Temp 97.8°F | Resp 16 | Wt 222.0 lb

## 2022-03-03 DIAGNOSIS — Z794 Long term (current) use of insulin: Secondary | ICD-10-CM | POA: Diagnosis not present

## 2022-03-03 DIAGNOSIS — Z79899 Other long term (current) drug therapy: Secondary | ICD-10-CM | POA: Insufficient documentation

## 2022-03-03 DIAGNOSIS — Z923 Personal history of irradiation: Secondary | ICD-10-CM | POA: Diagnosis not present

## 2022-03-03 DIAGNOSIS — Z7984 Long term (current) use of oral hypoglycemic drugs: Secondary | ICD-10-CM | POA: Diagnosis not present

## 2022-03-03 DIAGNOSIS — Z9011 Acquired absence of right breast and nipple: Secondary | ICD-10-CM | POA: Insufficient documentation

## 2022-03-03 DIAGNOSIS — Z9221 Personal history of antineoplastic chemotherapy: Secondary | ICD-10-CM | POA: Diagnosis not present

## 2022-03-03 DIAGNOSIS — Z79811 Long term (current) use of aromatase inhibitors: Secondary | ICD-10-CM | POA: Diagnosis not present

## 2022-03-03 DIAGNOSIS — Z7982 Long term (current) use of aspirin: Secondary | ICD-10-CM | POA: Insufficient documentation

## 2022-03-03 DIAGNOSIS — C50812 Malignant neoplasm of overlapping sites of left female breast: Secondary | ICD-10-CM | POA: Diagnosis not present

## 2022-03-03 DIAGNOSIS — Z853 Personal history of malignant neoplasm of breast: Secondary | ICD-10-CM

## 2022-03-03 DIAGNOSIS — N6313 Unspecified lump in the right breast, lower outer quadrant: Secondary | ICD-10-CM | POA: Diagnosis not present

## 2022-03-03 DIAGNOSIS — Z170 Estrogen receptor positive status [ER+]: Secondary | ICD-10-CM | POA: Diagnosis not present

## 2022-03-03 NOTE — Progress Notes (Signed)
? ?Symptom Management Clinic ?[place] [facility] ?[phone] [phone] ? ?Patient Care Team: ?Cammie Sickle, MD as PCP - General (Internal Medicine) ?Herbert Pun, MD as Consulting Physician (General Surgery) ?Cammie Sickle, MD as Consulting Physician (Hematology and Oncology)  ? ?Name of the patient: Nancy Gaines  ?[id number]  ?11/27/1945  ? ?Date of visit: 03/03/22 ? ?Reason for Consult: ?Nancy Gaines is a 76 y.o. female who presents today for: ? ?Breast Pain: Patient who has a history of Right breast cancer x 2 S/P mastectomy states that she has noticed that the right scar along her bra line has been tender. She has noticed this for the past week. No known injury. She reports that the pain is dull 2/10 in nature and only occurs when she palpates the area. She has not tried anything for it. She denies unintentional weight loss, new or worsening night sweats, fatigue, other areas of concern.  ? ? ?Denies any neurologic complaints. Denies recent fevers or illnesses. Denies any easy bleeding or bruising. Reports good appetite and denies weight loss. Denies chest pain. Denies any nausea, vomiting, constipation, or diarrhea. Denies urinary complaints. Patient offers no further specific complaints today. ? ? ? ?PAST MEDICAL HISTORY: ?Past Medical History:  ?Diagnosis Date  ? Anemia   ? Breast cancer ([place]) 2014  ? right breast ca- chemo and mastectomy  ? Cancer [facility]) 1996  ? right breast cancer- radiation and lumpectomy  ? Degenerative joint disease   ? Dermatophytosis of nail   ? Diabetes mellitus without complication ([place])   ? Diverticulosis   ? Hypercholesterolemia   ? Hypertension   ? Personal history of chemotherapy 2014  ? BREAST CA  ? Personal history of radiation therapy 1996  ? BREAST CA  ? ? ?PAST SURGICAL HISTORY:  ?Past Surgical History:  ?Procedure Laterality Date  ? ABDOMINAL HYSTERECTOMY    ? COLONOSCOPY WITH PROPOFOL N/A  08/29/2015  ? Procedure: COLONOSCOPY WITH PROPOFOL;  [name], MD;  Location: [facility] ENDOSCOPY;  Service: Gastroenterology;  [name];  ? DILATION AND CURETTAGE, DIAGNOSTIC / THERAPEUTIC    ? MASTECTOMY Right 2014  ? BREAST CA  ? ? ?HEMATOLOGY/ONCOLOGY HISTORY:  ?Oncology History Overview Note  ?# 1996- Right breast. 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 ? ?#  December of 2013, [name], [name], [name]; [name], 2014) ?7. Patient was started on Cytoxan and Taxotere January 11, 2013; Patient is on letrozole from April of 2014 ? ?# NOV 2016- BMD- Normal. ? ?3. BRCA negative (verbal information from [place]) ? ?# mild anemia- ? Thalassemia- Hb ~11; No IDA-colo- 2016 -wnl. ? ?# SURVIVORSHIP: O ? ?# GENETICS: NA ? ?DIAGNOSIS: breast cancer ? ?STAGE:   I      ;  GOALS: cure ? ?CURRENT/MOST RECENT THERAPY : a letrozole ? ? ? ? ?  ?  Carcinoma of overlapping sites of left breast in female, estrogen receptor positive ([place])  ? ? ?ALLERGIES:  is allergic to lipitor [atorvastatin]. ? ?MEDICATIONS:  ?Current Outpatient Medications  ?Medication Sig Dispense Refill  ? aspirin 81 MG tablet Take 81 mg by mouth daily.    ? Calcium Carb-Cholecalciferol 600-20 MG-MCG TABS Take 1 tablet by mouth daily.    ? Cholecalciferol (VITAMIN D3) 1000  UNITS CAPS Take by mouth.    ? ferrous sulfate 325 (65 FE) MG tablet Take 325 mg by mouth daily with breakfast.     ? glimepiride (AMARYL) 2 MG tablet Take 2 mg by mouth daily with breakfast.    ? glucose blood test strip Use once daily. Use as instructed.    ? letrozole (FEMARA) 2.5 MG tablet Take 1 tablet (2.5 mg total) by mouth daily. 90 tablet 3  ? levothyroxine (SYNTHROID) 25 MCG tablet Take by mouth.    ? losartan-hydrochlorothiazide (HYZAAR) 100-12.5 MG tablet Take 1 tablet by mouth daily.    ? metFORMIN (GLUCOPHAGE) 1000 MG tablet Take 1,000 mg by mouth 2 (two) times daily with a meal.     ? Multiple Vitamin (MULTI-VITAMINS) TABS Take by mouth.    ? NOVOLIN N 100 UNIT/ML injection Inject 35 Units into the skin daily before breakfast. And 20 units in the evening with dinner    ? pravastatin (PRAVACHOL) 20 MG tablet Take 1 tablet by mouth at bedtime.    ? ?No current facility-administered medications for this visit.  ? ?Facility-Administered Medications Ordered in Other Visits  ?Medication Dose Route Frequency Provider Last Rate Last Admin  ? heparin lock flush 100 unit/mL  500 Units Intravenous Once Charlaine Dalton R, MD      ? heparin lock flush 100 unit/mL  500 Units Intravenous Once Charlaine Dalton R, MD      ? sodium chloride flush (NS) 0.9 % injection 10 mL  10 mL Intravenous Once Charlaine Dalton R, MD      ? sodium chloride flush (NS) 0.9 % injection 10 mL  10 mL Intravenous Once Charlaine Dalton R, MD      ? sodium chloride flush (NS) 0.9 % injection 10 mL  10 mL Intravenous PRN Cammie Sickle, MD      ? ? ?VITAL SIGNS: ?BP 136/74 (BP Location: Left Arm, Patient Position: Sitting)   Pulse 88   Temp 97.8 ?F (36.6 ?C) (Tympanic)   Resp 16   Wt 222 lb (100.7 kg)   SpO2 99%   BMI 40.60 kg/m?  ?Filed Weights  ? 03/03/22 0917  ?Weight: 222 lb (100.7 kg)  ?  ?Estimated body mass index is 40.6 kg/m? as calculated from the following: ?  Height as of 12/11/21: '5\' 2"'  (1.575 m). ?   Weight as of this encounter: 222 lb (100.7 kg). ? ?LABS: ?CBC: ?   ?Component Value Date/Time  ? WBC 7.8 12/11/2021 0858  ? HGB 12.0 12/11/2021 0858  ? HGB 11.4 (L) 05/06/2014 1039  ? HCT 40.2 12/11/2021 0858  ? HCT 36.8 05/06/2014 1039  ? PLT 366 12/11/2021 0858  ? PLT 322 05/06/2014 1039  ? MCV 69.3 (L) 12/11/2021 0858  ? MCV 69 (L) 05/06/2014 1039  ? NEUTROABS 3.4 12/11/2021 0858  ? NEUTROABS 4.4 05/06/2014 1039  ? LYMPHSABS 3.7 12/11/2021 0858  ? LYMPHSABS 1.9 05/06/2014 1039  ? MONOABS 0.6 12/11/2021 0858  ? MONOABS 0.4 05/06/2014 1039  ? EOSABS 0.2 12/11/2021 0858  ? EOSABS 0.1 05/06/2014 1039  ?  BASOSABS 0.0 12/11/2021 0858  ? BASOSABS 0.0 05/06/2014 1039  ? ?Comprehensive Metabolic Panel: ?   ?Component Value Date/Time  ? NA 140 12/11/2021 0858  ? NA 141 05/06/2014 1039  ? K 4.4 12/11/2021 0858  ? K 4.4 05/06/2014 1039  ? CL 100 12/11/2021 0858  ? CL 104 05/06/2014 1039  ? CO2 26 12/11/2021 0858  ? CO2 30 05/06/2014 1039  ? BUN 23 12/11/2021 0858  ? BUN 21 (H) 05/06/2014 1039  ? CREATININE 1.04 (H) 12/11/2021 0858  ? CREATININE 1.04 05/06/2014 1039  ? GLUCOSE 221 (H) 12/11/2021 0858  ? GLUCOSE 184 (H) 05/06/2014 1039  ? CALCIUM 10.1 12/11/2021 0858  ? CALCIUM 9.7 05/06/2014 1039  ? AST 26 12/11/2021 0858  ? AST 21 05/06/2014 1039  ? ALT 39 12/11/2021 0858  ? ALT 63 05/06/2014 1039  ? ALKPHOS 88 12/11/2021 0858  ? ALKPHOS 102 05/06/2014 1039  ? BILITOT 0.6 12/11/2021 0858  ? BILITOT 0.7 05/06/2014 1039  ? PROT 7.3 12/11/2021 0858  ? PROT 7.3 05/06/2014 1039  ? ALBUMIN 4.0 12/11/2021 0858  ? ALBUMIN 3.7 05/06/2014 1039  ? ? ?RADIOGRAPHIC STUDIES: ?No results found. ? ?PERFORMANCE STATUS (ECOG) : 1 - Symptomatic but completely ambulatory ? ?Review of Systems ?Unless otherwise noted, a complete review of systems is negative. ? ?Physical Exam ?Chest:  ?Breasts: ?   Right: Tenderness present.  ?   Left: Normal.  ? ? ?Lymphadenopathy:  ?   Upper Body:  ?   Right upper body: No supraclavicular, axillary or pectoral  adenopathy.  ? ?General: NAD ?Cardiovascular: regular rate and rhythm ?Pulmonary: clear ant fields ?Abdomen: soft, nontender, + bowel sounds ?GU: no suprapubic tenderness ?Extremities: no edema, no joint deformities ?Sk

## 2022-03-03 NOTE — Progress Notes (Signed)
Pt in for symptom management for concern of pain and tenderness in right breast at mastectomy incision site. ?

## 2022-03-05 ENCOUNTER — Ambulatory Visit
Admission: RE | Admit: 2022-03-05 | Discharge: 2022-03-05 | Disposition: A | Discharge status: Home / Self Care | Payer: Medicare Other | Source: Ambulatory Visit | Attending: Medical Oncology | Admitting: Medical Oncology

## 2022-03-05 DIAGNOSIS — Z853 Personal history of malignant neoplasm of breast: Secondary | ICD-10-CM | POA: Diagnosis present

## 2022-03-09 ENCOUNTER — Encounter: Payer: Self-pay | Admitting: *Deleted

## 2022-03-09 ENCOUNTER — Encounter: Payer: Medicare Other | Attending: Internal Medicine | Admitting: *Deleted

## 2022-03-09 VITALS — BP 148/80 | Ht 62.0 in | Wt 220.3 lb

## 2022-03-09 DIAGNOSIS — E1165 Type 2 diabetes mellitus with hyperglycemia: Secondary | ICD-10-CM | POA: Insufficient documentation

## 2022-03-09 DIAGNOSIS — E669 Obesity, unspecified: Secondary | ICD-10-CM | POA: Diagnosis not present

## 2022-03-09 DIAGNOSIS — Z794 Long term (current) use of insulin: Secondary | ICD-10-CM | POA: Insufficient documentation

## 2022-03-09 DIAGNOSIS — Z6839 Body mass index (BMI) 39.0-39.9, adult: Secondary | ICD-10-CM | POA: Diagnosis not present

## 2022-03-09 NOTE — Progress Notes (Signed)
Diabetes Self-Management Education ? ?Visit Type: First/Initial ? ?Appt. Start Time: 0830 Appt. End Time: 2992 ? ?03/09/2022 ? ?Ms. Nancy Gaines, identified by name and date of birth, is a 76 y.o. female with a diagnosis of Diabetes: Type 2.  ? ?ASSESSMENT ? ?Blood pressure (!) 148/80, height '5\' 2"'$  (1.575 m), weight 220 lb 4.8 oz (99.9 kg). ?Body mass index is 40.29 kg/m?. ? ? Diabetes Self-Management Education - 03/09/22 1013   ? ?  ? Visit Information  ? Visit Type First/Initial   ?  ? Initial Visit  ? Diabetes Type Type 2   ? Date Diagnosed 15 years ago   ? Are you currently following a meal plan? No   ? Are you taking your medications as prescribed? Yes   ?  ? Health Coping  ? How would you rate your overall health? Fair   ?  ? Psychosocial Assessment  ? Patient Belief/Attitude about Diabetes Defeat/Burnout   "I don't like it"  ? What is the hardest part about your diabetes right now, causing you the most concern, or is the most worrisome to you about your diabetes?   Making healty food and beverage choices   ? Self-care barriers None   ? Self-management support Doctor's office;Family   ? Patient Concerns Nutrition/Meal planning;Medication;Monitoring;Healthy Lifestyle;Glycemic Control;Weight Control   ? Special Needs None   ? Preferred Learning Style Visual;Other (comment)   talking/discussion  ? Learning Readiness Ready   ? How often do you need to have someone help you when you read instructions, pamphlets, or other written materials from your doctor or pharmacy? 1 - Never   ? What is the last grade level you completed in school? 12th   ?  ? Pre-Education Assessment  ? Patient understands the diabetes disease and treatment process. Needs Instruction   ? Patient understands incorporating nutritional management into lifestyle. Needs Instruction   ? Patient undertands incorporating physical activity into lifestyle. Needs Instruction   ? Patient understands using medications safely. Needs Review   ? Patient  understands monitoring blood glucose, interpreting and using results Needs Review   ? Patient understands prevention, detection, and treatment of acute complications. Needs Instruction   ? Patient understands prevention, detection, and treatment of chronic complications. Needs Review   ? Patient understands how to develop strategies to address psychosocial issues. Needs Instruction   ? Patient understands how to develop strategies to promote health/change behavior. Needs Instruction   ?  ? Complications  ? Last HgB A1C per patient/outside source 10.1 %   02/15/2022  ? How often do you check your blood sugar? 0 times/day (not testing)   Med list has FreeStyle [place] but patient reports she doesn't have one. She will go to the pharmacy after this appointment. She doesn't like to stick her fingers. BG in the office today was 232 mg/dL at 9:35 am - fasting. No insulin today  ? Have you had a dilated eye exam in the past 12 months? Yes   ? Have you had a dental exam in the past 12 months? No   ? Are you checking your feet? Yes   ? How many days per week are you checking your feet? 7   ?  ? Dietary Intake  ? Breakfast skips   ? Lunch left-overs   ? Snack (afternoon) 0-1 snacks/day - nuts, chips sometimes fruit (apple, orange)   ? Dinner chicken, fish, occasional beef and pork; occasional potatoes, beans, peas, corn, rice, green beans, broccoli, salads  with lettuce tomatoes and cheese   ? Beverage(s) water, soda, tea, diet ginger-ale and cranberry juice   ?  ? Activity / Exercise  ? Activity / Exercise Type ADL's   ?  ? Patient Education  ? Previous Diabetes Education No   ? Disease Pathophysiology Definition of diabetes, type 1 and 2, and the diagnosis of diabetes;Factors that contribute to the development of diabetes;Explored patient's options for treatment of their diabetes   ? Healthy Eating Role of diet in the treatment of diabetes and the relationship between the three main macronutrients and blood glucose level;Food  label reading, portion sizes and measuring food.;Reviewed blood glucose goals for pre and post meals and how to evaluate the patients' food intake on their blood glucose level.;Meal timing in regards to the patients' current diabetes medication.   ? Being Active Role of exercise on diabetes management, blood pressure control and cardiac health.   ? Medications Taught/reviewed insulin/injectables, injection, site rotation, insulin/injectables storage and needle disposal.;Reviewed patients medication for diabetes, action, purpose, timing of dose and side effects.   ? Monitoring Taught/evaluated CGM (comment);Taught/discussed recording of test results and interpretation of SMBG.;Identified appropriate SMBG and/or A1C goals.   ? Acute complications Taught prevention, symptoms, and  treatment of hypoglycemia - the 15 rule.   ? Chronic complications Relationship between chronic complications and blood glucose control   ? Diabetes Stress and Support Identified and addressed patients feelings and concerns about diabetes;Role of stress on diabetes   ?  ? Individualized Goals (developed by patient)  ? Reducing Risk Other (comment)   improve blood sugars, decrease medications, prevent diabetes complications, lose weight, lead a healthier lifestyle  ?  ? Outcomes  ? Expected Outcomes Demonstrated interest in learning. Expect positive outcomes   ? ?  ?  ? ?Individualized Plan for Diabetes Self-Management Training:  ? ?Learning Objective:  Patient will have a greater understanding of diabetes self-management. ?Patient education plan is to attend individual and/or group sessions per assessed needs and concerns. ?  ?Plan:  ? ?Patient Instructions  ?Check with pharmacy about prescription for FreeStyle CGM ? ?Exercise:  Begin walking for    10  minutes   3  days a week and gradually increase to 30 minutes 5 x week ? ?Eat 3 meals day,   1-2  snacks a day ?Space meals 4-6 hours apart ?Don't skip meals - eat at least 1 protein and 1  carbohydrate serving ?Avoid sugar sweetened drinks (soda, tea, juices) unless treating a low blood sugar ? ?Take NPH Insulin 30 minutes before breakfast and supper ?Carry fast acting glucose and a snack at all times ?Rotate injection sites ? ?Expected Outcomes:  Demonstrated interest in learning. Expect positive outcomes ? ?Education material provided:  ?Metallurgist Guidelines ?Simple Meal Plan ?Glucose tablets ?Symptoms, causes and treatments of Hypoglycemia ?  ?If problems or questions, patient to contact team via:   ?Johny Drilling, RN, [place], [place] [phone] ? ?Future DSME appointment: ?Mar 11, 2022 for Diabetes Class 1 ?

## 2022-03-09 NOTE — Patient Instructions (Signed)
Check with pharmacy about prescription for FreeStyle CGM ? ?Exercise:  Begin walking for    10  minutes   3  days a week and gradually increase to 30 minutes 5 x week ? ?Eat 3 meals day,   1-2  snacks a day ?Space meals 4-6 hours apart ?Don't skip meals - eat at least 1 protein and 1 carbohydrate serving ?Avoid sugar sweetened drinks (soda, tea, juices) unless treating a low blood sugar ? ?Take NPH Insulin 30 minutes before breakfast and supper ?Carry fast acting glucose and a snack at all times ?Rotate injection sites ? ?Return for classes on:    ?

## 2022-03-11 ENCOUNTER — Encounter: Payer: Self-pay | Admitting: *Deleted

## 2022-03-11 ENCOUNTER — Encounter: Payer: Medicare Other | Admitting: *Deleted

## 2022-03-11 VITALS — Wt 220.2 lb

## 2022-03-11 DIAGNOSIS — E1165 Type 2 diabetes mellitus with hyperglycemia: Secondary | ICD-10-CM

## 2022-03-11 NOTE — Progress Notes (Signed)

## 2022-03-15 ENCOUNTER — Other Ambulatory Visit: Payer: Self-pay | Admitting: Medical Oncology

## 2022-03-15 NOTE — Progress Notes (Signed)
Entered to place MR breast order- per system this is already ordered. Please disregard  ?

## 2022-03-18 ENCOUNTER — Encounter: Payer: Self-pay | Admitting: *Deleted

## 2022-03-18 ENCOUNTER — Encounter: Payer: Medicare Other | Admitting: *Deleted

## 2022-03-18 VITALS — Wt 217.2 lb

## 2022-03-18 DIAGNOSIS — E1165 Type 2 diabetes mellitus with hyperglycemia: Secondary | ICD-10-CM

## 2022-03-18 DIAGNOSIS — Z794 Long term (current) use of insulin: Secondary | ICD-10-CM

## 2022-03-18 NOTE — Progress Notes (Signed)

## 2022-03-25 ENCOUNTER — Encounter: Payer: Medicare Other | Admitting: *Deleted

## 2022-03-25 ENCOUNTER — Encounter: Payer: Self-pay | Admitting: *Deleted

## 2022-03-25 VITALS — BP 126/82 | Wt 216.4 lb

## 2022-03-25 DIAGNOSIS — E1165 Type 2 diabetes mellitus with hyperglycemia: Secondary | ICD-10-CM

## 2022-03-25 NOTE — Progress Notes (Signed)

## 2022-06-11 ENCOUNTER — Inpatient Hospital Stay: Payer: Medicare Other | Attending: Internal Medicine

## 2022-06-11 ENCOUNTER — Encounter: Payer: Self-pay | Admitting: Internal Medicine

## 2022-06-11 ENCOUNTER — Other Ambulatory Visit: Payer: Self-pay

## 2022-06-11 ENCOUNTER — Inpatient Hospital Stay (HOSPITAL_BASED_OUTPATIENT_CLINIC_OR_DEPARTMENT_OTHER): Payer: Medicare Other | Admitting: Internal Medicine

## 2022-06-11 DIAGNOSIS — C50812 Malignant neoplasm of overlapping sites of left female breast: Secondary | ICD-10-CM | POA: Diagnosis present

## 2022-06-11 DIAGNOSIS — D509 Iron deficiency anemia, unspecified: Secondary | ICD-10-CM | POA: Diagnosis not present

## 2022-06-11 DIAGNOSIS — E1122 Type 2 diabetes mellitus with diabetic chronic kidney disease: Secondary | ICD-10-CM | POA: Diagnosis not present

## 2022-06-11 DIAGNOSIS — N183 Chronic kidney disease, stage 3 unspecified: Secondary | ICD-10-CM | POA: Insufficient documentation

## 2022-06-11 DIAGNOSIS — Z794 Long term (current) use of insulin: Secondary | ICD-10-CM | POA: Insufficient documentation

## 2022-06-11 DIAGNOSIS — Z79899 Other long term (current) drug therapy: Secondary | ICD-10-CM | POA: Insufficient documentation

## 2022-06-11 DIAGNOSIS — Z79811 Long term (current) use of aromatase inhibitors: Secondary | ICD-10-CM | POA: Insufficient documentation

## 2022-06-11 DIAGNOSIS — Z7982 Long term (current) use of aspirin: Secondary | ICD-10-CM | POA: Insufficient documentation

## 2022-06-11 DIAGNOSIS — Z9221 Personal history of antineoplastic chemotherapy: Secondary | ICD-10-CM | POA: Diagnosis not present

## 2022-06-11 DIAGNOSIS — Z7984 Long term (current) use of oral hypoglycemic drugs: Secondary | ICD-10-CM | POA: Diagnosis not present

## 2022-06-11 DIAGNOSIS — Z170 Estrogen receptor positive status [ER+]: Secondary | ICD-10-CM | POA: Insufficient documentation

## 2022-06-11 DIAGNOSIS — Z1231 Encounter for screening mammogram for malignant neoplasm of breast: Secondary | ICD-10-CM

## 2022-06-11 DIAGNOSIS — Z9011 Acquired absence of right breast and nipple: Secondary | ICD-10-CM | POA: Insufficient documentation

## 2022-06-11 DIAGNOSIS — Z923 Personal history of irradiation: Secondary | ICD-10-CM | POA: Diagnosis not present

## 2022-06-11 LAB — COMPREHENSIVE METABOLIC PANEL
ALT: 45 U/L — ABNORMAL HIGH (ref 0–44)
AST: 36 U/L (ref 15–41)
Albumin: 4.1 g/dL (ref 3.5–5.0)
Alkaline Phosphatase: 82 U/L (ref 38–126)
Anion gap: 15 (ref 5–15)
BUN: 24 mg/dL — ABNORMAL HIGH (ref 8–23)
CO2: 22 mmol/L (ref 22–32)
Calcium: 9.8 mg/dL (ref 8.9–10.3)
Chloride: 104 mmol/L (ref 98–111)
Creatinine, Ser: 1.27 mg/dL — ABNORMAL HIGH (ref 0.44–1.00)
GFR, Estimated: 44 mL/min — ABNORMAL LOW (ref >60)
Glucose, Bld: 215 mg/dL — ABNORMAL HIGH (ref 70–99)
Potassium: 3.6 mmol/L (ref 3.5–5.1)
Sodium: 141 mmol/L (ref 135–145)
Total Bilirubin: 0.9 mg/dL (ref 0.3–1.2)
Total Protein: 7.2 g/dL (ref 6.5–8.1)

## 2022-06-11 LAB — CBC WITH DIFFERENTIAL/PLATELET
Abs Immature Granulocytes: 0.03 10*3/uL (ref 0.00–0.07)
Basophils Absolute: 0 10*3/uL (ref 0.0–0.1)
Basophils Relative: 0 %
Eosinophils Absolute: 0.1 10*3/uL (ref 0.0–0.5)
Eosinophils Relative: 1 %
HCT: 39.5 % (ref 36.0–46.0)
Hemoglobin: 12.2 g/dL (ref 12.0–15.0)
Immature Granulocytes: 0 %
Lymphocytes Relative: 41 %
Lymphs Abs: 3.1 10*3/uL (ref 0.7–4.0)
MCH: 21.1 pg — ABNORMAL LOW (ref 26.0–34.0)
MCHC: 30.9 g/dL (ref 30.0–36.0)
MCV: 68.2 fL — ABNORMAL LOW (ref 80.0–100.0)
Monocytes Absolute: 0.5 10*3/uL (ref 0.1–1.0)
Monocytes Relative: 7 %
Neutro Abs: 3.8 10*3/uL (ref 1.7–7.7)
Neutrophils Relative %: 51 %
Platelets: 342 10*3/uL (ref 150–400)
RBC: 5.79 MIL/uL — ABNORMAL HIGH (ref 3.87–5.11)
RDW: 15.8 % — ABNORMAL HIGH (ref 11.5–15.5)
WBC: 7.6 10*3/uL (ref 4.0–10.5)
nRBC: 0 % (ref 0.0–0.2)

## 2022-06-11 NOTE — Assessment & Plan Note (Addendum)
#  RIGHT BREAST IPISILATERAL RECURRENCE- STAGE I Breast cancer ER/PR positive HER-2/neu negative- currently on Ext.AI- letrozole [since April 2014].  STABLE.    # Clinically no evidence of recurrence. Continue letrozole for now- extended  [until 2024 spring].  STABLE. LEFT mammo-FEB 2023-WNL.   # Microcytic anemia- hemoglobin 10-11-suspect thalassemia.  Asymptomatic; STABLE>   # BMD- T score+-0.3; normal [FEB 2021];  Continue calcium plus vitamin D; order BMD today with mammo for 2024.  # DM- BG 215- fasting; HbA1C- 10.1 [April 2023] -poorly controlled recommend follow up with PCP/ recommend weight loss. Recommend cutting down- soda/processed food. Would recommend ozempic [concerns with insurance; AEs].  Encouraged the patient to keep losing weight.  # CKD- Stage III- 44- discussed related to Diabetes.  Counseled the patient regarding the importance of weight loss/better control of diabetes.  Patient seems motivated.  See above.   # DISPOSITION: # follow up in 6 months//2-3 weeks after the mammo/BMD in feb 2024 -MD; labs-cbc/cmp-;Dr.B

## 2022-06-11 NOTE — Progress Notes (Signed)
[facility] OFFICE PROGRESS NOTE  Patient Care Team: Tracie Harrier, [name], [name], MD as Consulting Physician (Hematology and Oncology)   Cancer Staging  No matching staging information was found for the patient.   Oncology History Overview Note  # 1996- Right breast. 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, [name], [name], [name]; [name], 2014) 7. Patient was started on Cytoxan and Taxotere January 11, 2013; Patient is on letrozole from April of 2014  # NOV 2016- BMD- Normal.  3. BRCA negative (verbal information from [facility])  # mild anemia- ? Thalassemia- Hb ~11; [name]      ;  GOALS: cure  CURRENT/MOST RECENT THERAPY : a letrozole       Carcinoma of overlapping sites of right breast in female, estrogen receptor positive ([place])     INTERVAL HISTORY: Ambulating independently.  Alone.  Nancy Gaines 76 y.o.  female pleasant patient above history of Breast cancer ER/PR positive HER-2/neu negative stage I- currently on aLetrozole is here for  follow-up.  Patient stated that she is still struggling to get her diabetes under control.  Admits to lack of exercise/poor dietary discretion.  States that she is trying to watch her carbohydrates.  However had this morning had rice cakes.  States concerns regarding cost of Ozempic/side effects.  Patient denies any unusual lumps or bumps.  Admits to compliance with her letrozole.  No nausea no vomiting.  No worsening back pain.  Also complains of memory issues intermittently.   Review of Systems  Constitutional:  Negative for chills, diaphoresis, fever, [name], hemoptysis, sputum production, [name], palpitations, orthopnea and leg swelling.  Gastrointestinal:  Negative for abdominal pain, blood in stool, constipation, diarrhea, heartburn, melena, nausea and vomiting.  Genitourinary:  Negative for dysuria, frequency and urgency.  Musculoskeletal:  Positive for joint pain. Negative for back pain.  Skin: Negative.  Negative for itching and rash.  Neurological:  Negative for dizziness, tingling, focal weakness, weakness and headaches.  Endo/Heme/Allergies:  Does not bruise/bleed easily.  Psychiatric/Behavioral:  Negative for depression. The patient is not nervous/anxious and does not have insomnia.      PAST MEDICAL HISTORY :  Past Medical History:  Diagnosis Date  . Anemia   . Breast cancer ([place]) 2014   right breast ca- chemo and mastectomy  . Cancer [facility] 1996   right breast cancer- radiation and lumpectomy  .  Degenerative joint disease   . Dermatophytosis of nail   . Diabetes mellitus without complication ([place])   . Diverticulosis   . Hypercholesterolemia   . Hypertension   . Personal history of chemotherapy 2014   BREAST CA  . Personal history of radiation therapy  1996   BREAST CA    PAST SURGICAL HISTORY :   Past Surgical History:  Procedure Laterality Date  . ABDOMINAL HYSTERECTOMY    . COLONOSCOPY WITH PROPOFOL N/A 08/29/2015   Procedure: COLONOSCOPY WITH PROPOFOL;  [name], MD;  Location: [facility] ENDOSCOPY;  Service: Gastroenterology;  [name];  . DILATION AND CURETTAGE, DIAGNOSTIC / THERAPEUTIC    . MASTECTOMY Right 2014   BREAST CA    FAMILY HISTORY :   Family History  Problem Relation Age of Onset  . Diabetes Sister   . Breast cancer Sister 86  . Breast cancer Maternal Aunt   . Breast cancer Paternal Aunt     SOCIAL HISTORY:   Social History   Tobacco Use  . Smoking status: Never  . Smokeless tobacco: Never  Vaping Use  . Vaping Use: Never used  Substance Use Topics  . Alcohol use: No  . Drug use: No    ALLERGIES:  is allergic to lipitor [atorvastatin].  MEDICATIONS:  Current Outpatient Medications  Medication Sig Dispense Refill  . aspirin 81 MG tablet Take 81 mg by mouth daily.    . Calcium Carb-Cholecalciferol 600-20 MG-MCG TABS Take 1 tablet by mouth daily.    . ferrous sulfate 325 (65 FE) MG tablet Take 325 mg by mouth daily with breakfast.     . glimepiride (AMARYL) 4 MG tablet Take 4 mg by mouth 2 (two) times daily.    Marland Kitchen letrozole (FEMARA) 2.5 MG tablet Take 1 tablet (2.5 mg total) by mouth daily. 90 tablet 3  . levothyroxine (SYNTHROID) 50 MCG tablet Take 50 mcg by mouth daily.    Marland Kitchen losartan-hydrochlorothiazide (HYZAAR) 100-12.5 MG tablet Take 1 tablet by mouth daily.    . metFORMIN (GLUCOPHAGE) 1000 MG tablet Take 1,000 mg by mouth 2 (two) times daily with a meal.     . Multiple Vitamin (MULTI-VITAMINS) TABS Take by mouth.    Marland Kitchen NOVOLIN N 100 UNIT/ML injection Inject 35 Units into the skin daily before breakfast. And 20 units in the evening with dinner    . pravastatin (PRAVACHOL) 20 MG tablet Take 1 tablet by mouth at bedtime.    Marland Kitchen glucose blood test strip Use once daily. Use as instructed.  (Patient not taking: Reported on 03/09/2022)     No current facility-administered medications for this visit.   Facility-Administered Medications Ordered in Other Visits  Medication Dose Route Frequency Provider Last Rate Last Admin  . heparin lock flush 100 unit/mL  500 Units Intravenous Once Charlaine Dalton R, MD      . heparin lock flush 100 unit/mL  500 Units Intravenous Once Charlaine Dalton R, MD      . sodium chloride flush (NS) 0.9 % injection 10 mL  10 mL Intravenous Once Charlaine Dalton R, MD      . sodium chloride flush (NS) 0.9 % injection 10 mL  10 mL Intravenous Once Charlaine Dalton R, MD      . sodium chloride flush (NS) 0.9 % injection 10 mL  10 mL Intravenous PRN Cammie Sickle, MD        PHYSICAL EXAMINATION: ECOG PERFORMANCE STATUS: 0 - Asymptomatic  BP (!) 146/69 (BP Location:  Left Arm, Patient Position: Sitting, Cuff Size: Large)   Pulse 99   Temp (!) 95.5 F (35.3 C) (Tympanic)   Ht _0  (1.575 m)   Wt 214 lb 11.2 oz (97.4 kg)   SpO2 99%   BMI 39.27 kg/m   Filed Weights   06/11/22 0950  Weight: 214 lb 11.2 oz (97.4 kg)     Physical Exam HENT:     Head: Normocephalic and atraumatic.     Mouth/Throat:     Pharynx: No oropharyngeal exudate.  Eyes:     Pupils: Pupils are equal, round, and reactive to light.  Cardiovascular:     Rate and Rhythm: Normal rate and regular rhythm.  Pulmonary:     Effort: No respiratory distress.     Breath sounds: No wheezing.  Abdominal:     General: Bowel sounds are normal. There is no distension.     Palpations: Abdomen is soft. There is no mass.     Tenderness: There is no abdominal tenderness. There is no guarding or rebound.  Musculoskeletal:        General: No tenderness. Normal range of motion.     Cervical back: Normal range of motion and neck supple.     Comments: Left wrist is swollen tender.  Movement intact  Skin:    General: Skin is warm.     Comments: Discoloration of the left  third toe/nail bed.  Neurological:     Mental Status: She is alert and oriented to person, place, and time.  Psychiatric:        Mood and Affect: Affect normal.    .  LABORATORY DATA:  I have reviewed the data as listed    Component Value Date/Time   NA 141 06/11/2022 0946   NA 141 05/06/2014 1039   K 3.6 06/11/2022 0946   K 4.4 05/06/2014 1039   CL 104 06/11/2022 0946   CL 104 05/06/2014 1039   CO2 22 06/11/2022 0946   CO2 30 05/06/2014 1039   GLUCOSE 215 (H) 06/11/2022 0946   GLUCOSE 184 (H) 05/06/2014 1039   BUN 24 (H) 06/11/2022 0946   BUN 21 (H) 05/06/2014 1039   CREATININE 1.27 (H) 06/11/2022 0946   CREATININE 1.04 05/06/2014 1039   CALCIUM 9.8 06/11/2022 0946   CALCIUM 9.7 05/06/2014 1039   PROT 7.2 06/11/2022 0946   PROT 7.3 05/06/2014 1039   ALBUMIN 4.1 06/11/2022 0946   ALBUMIN 3.7 05/06/2014 1039   AST 36 06/11/2022 0946   AST 21 05/06/2014 1039   ALT 45 (H) 06/11/2022 0946   ALT 63 05/06/2014 1039   ALKPHOS 82 06/11/2022 0946   ALKPHOS 102 05/06/2014 1039   BILITOT 0.9 06/11/2022 0946   BILITOT 0.7 05/06/2014 1039   GFRNONAA 44 (L) 06/11/2022 0946   GFRNONAA 56 (L) 05/06/2014 1039   GFRAA >60 06/05/2020 1012   GFRAA >60 05/06/2014 1039    No results found for: "SPEP", "UPEP"  Lab Results  Component Value Date   WBC 7.6 06/11/2022   NEUTROABS 3.8 06/11/2022   HGB 12.2 06/11/2022   HCT 39.5 06/11/2022   MCV 68.2 (L) 06/11/2022   PLT 342 06/11/2022      Chemistry      Component Value Date/Time   NA 141 06/11/2022 0946   NA 141 05/06/2014 1039   K 3.6 06/11/2022 0946   K 4.4 05/06/2014 1039   CL 104 06/11/2022 0946   CL 104 05/06/2014 1039   CO2 22 06/11/2022 0946  CO2 30 05/06/2014 1039   BUN 24 (H) 06/11/2022 0946   BUN 21 (H) 05/06/2014 1039   CREATININE 1.27 (H) 06/11/2022 0946   CREATININE 1.04 05/06/2014 1039      Component Value Date/Time   CALCIUM 9.8 06/11/2022 0946   CALCIUM 9.7 05/06/2014 1039   ALKPHOS 82 06/11/2022  0946   ALKPHOS 102 05/06/2014 1039   AST 36 06/11/2022 0946   AST 21 05/06/2014 1039   ALT 45 (H) 06/11/2022 0946   ALT 63 05/06/2014 1039   BILITOT 0.9 06/11/2022 0946   BILITOT 0.7 05/06/2014 1039      RADIOGRAPHIC STUDIES: I have personally reviewed the radiological images as listed and agreed with the findings in the report. No results found.   ASSESSMENT & PLAN:  Carcinoma of overlapping sites of right breast in female, estrogen receptor positive ([place]) # RIGHT BREAST IPISILATERAL RECURRENCE- STAGE I Breast cancer ER/PR positive HER-2/neu negative- currently on Ext.AI- letrozole [since April 2014].  STABLE.    # Clinically no evidence of recurrence. Continue letrozole for now- extended  [until 2024 spring].  STABLE. LEFT mammo-FEB 2023-WNL.   # Microcytic anemia- hemoglobin 10-11-suspect thalassemia.  Asymptomatic; STABLE>   # BMD- T score+-0.3; normal [FEB 2021];  Continue calcium plus vitamin D; order BMD today with mammo for 2024.  # DM- BG 215- fasting; HbA1C- 10.1 [April 2023] -poorly controlled recommend follow up with PCP/ recommend weight loss. Recommend cutting down- soda/processed food. Would recommend ozempic [concerns with insurance; AEs].  Encouraged the patient to keep losing weight.  # CKD- Stage III- 44- discussed related to Diabetes.  Counseled the patient regarding the importance of weight loss/better control of diabetes.  Patient seems motivated.  See above.   # DISPOSITION: # follow up in 6 months//2-3 weeks after the mammo/BMD in feb 2024 -MD; labs-cbc/cmp-;Dr.B    No orders of the defined types were placed in this encounter.   All questions were answered. The patient knows to call the clinic with any problems, questions or concerns.      Cammie Sickle, MD 06/11/2022 10:49 AM

## 2022-12-20 ENCOUNTER — Ambulatory Visit
Admission: RE | Admit: 2022-12-20 | Discharge: 2022-12-20 | Disposition: A | Discharge status: Home / Self Care | Payer: Medicare HMO | Source: Ambulatory Visit | Attending: Internal Medicine | Admitting: Internal Medicine

## 2022-12-20 ENCOUNTER — Other Ambulatory Visit: Payer: Self-pay

## 2022-12-20 DIAGNOSIS — Z780 Asymptomatic menopausal state: Secondary | ICD-10-CM | POA: Diagnosis not present

## 2022-12-20 DIAGNOSIS — Z79811 Long term (current) use of aromatase inhibitors: Secondary | ICD-10-CM | POA: Diagnosis not present

## 2022-12-20 DIAGNOSIS — Z1231 Encounter for screening mammogram for malignant neoplasm of breast: Secondary | ICD-10-CM

## 2022-12-20 DIAGNOSIS — Z170 Estrogen receptor positive status [ER+]: Secondary | ICD-10-CM | POA: Diagnosis not present

## 2022-12-20 DIAGNOSIS — C50812 Malignant neoplasm of overlapping sites of left female breast: Secondary | ICD-10-CM | POA: Insufficient documentation

## 2022-12-20 MED ORDER — SHINGRIX 50 MCG/0.5ML IM SUSR
INTRAMUSCULAR | 1 refills | Status: DC
  Filled 2022-12-20: qty 1, 1d supply, fill #0
  Filled 2023-03-10: qty 1, 1d supply, fill #1

## 2022-12-23 ENCOUNTER — Other Ambulatory Visit: Payer: Medicare Other

## 2022-12-23 ENCOUNTER — Ambulatory Visit: Payer: Medicare Other | Admitting: Internal Medicine

## 2022-12-30 ENCOUNTER — Inpatient Hospital Stay: Payer: Medicare HMO | Admitting: Internal Medicine

## 2022-12-30 ENCOUNTER — Inpatient Hospital Stay: Payer: Medicare HMO | Attending: Internal Medicine

## 2022-12-30 ENCOUNTER — Encounter: Payer: Self-pay | Admitting: Internal Medicine

## 2022-12-30 VITALS — BP 124/80 | HR 89 | Temp 97.0°F | Resp 16 | Wt 218.0 lb

## 2022-12-30 DIAGNOSIS — Z79899 Other long term (current) drug therapy: Secondary | ICD-10-CM | POA: Diagnosis not present

## 2022-12-30 DIAGNOSIS — Z7982 Long term (current) use of aspirin: Secondary | ICD-10-CM | POA: Diagnosis not present

## 2022-12-30 DIAGNOSIS — Z794 Long term (current) use of insulin: Secondary | ICD-10-CM | POA: Diagnosis not present

## 2022-12-30 DIAGNOSIS — Z7984 Long term (current) use of oral hypoglycemic drugs: Secondary | ICD-10-CM | POA: Insufficient documentation

## 2022-12-30 DIAGNOSIS — C50811 Malignant neoplasm of overlapping sites of right female breast: Secondary | ICD-10-CM | POA: Diagnosis not present

## 2022-12-30 DIAGNOSIS — Z923 Personal history of irradiation: Secondary | ICD-10-CM | POA: Diagnosis not present

## 2022-12-30 DIAGNOSIS — Z9221 Personal history of antineoplastic chemotherapy: Secondary | ICD-10-CM | POA: Insufficient documentation

## 2022-12-30 DIAGNOSIS — C50812 Malignant neoplasm of overlapping sites of left female breast: Secondary | ICD-10-CM | POA: Insufficient documentation

## 2022-12-30 DIAGNOSIS — Z79811 Long term (current) use of aromatase inhibitors: Secondary | ICD-10-CM | POA: Diagnosis not present

## 2022-12-30 DIAGNOSIS — Z170 Estrogen receptor positive status [ER+]: Secondary | ICD-10-CM

## 2022-12-30 DIAGNOSIS — D509 Iron deficiency anemia, unspecified: Secondary | ICD-10-CM | POA: Insufficient documentation

## 2022-12-30 DIAGNOSIS — N183 Chronic kidney disease, stage 3 unspecified: Secondary | ICD-10-CM | POA: Diagnosis not present

## 2022-12-30 DIAGNOSIS — E1122 Type 2 diabetes mellitus with diabetic chronic kidney disease: Secondary | ICD-10-CM | POA: Diagnosis not present

## 2022-12-30 DIAGNOSIS — Z9011 Acquired absence of right breast and nipple: Secondary | ICD-10-CM | POA: Diagnosis not present

## 2022-12-30 LAB — CBC WITH DIFFERENTIAL/PLATELET
Abs Immature Granulocytes: 0.03 10*3/uL (ref 0.00–0.07)
Basophils Absolute: 0 10*3/uL (ref 0.0–0.1)
Basophils Relative: 1 %
Eosinophils Absolute: 0.1 10*3/uL (ref 0.0–0.5)
Eosinophils Relative: 2 %
HCT: 39.4 % (ref 36.0–46.0)
Hemoglobin: 11.9 g/dL — ABNORMAL LOW (ref 12.0–15.0)
Immature Granulocytes: 0 %
Lymphocytes Relative: 40 %
Lymphs Abs: 3.1 10*3/uL (ref 0.7–4.0)
MCH: 20.9 pg — ABNORMAL LOW (ref 26.0–34.0)
MCHC: 30.2 g/dL (ref 30.0–36.0)
MCV: 69.2 fL — ABNORMAL LOW (ref 80.0–100.0)
Monocytes Absolute: 0.5 10*3/uL (ref 0.1–1.0)
Monocytes Relative: 7 %
Neutro Abs: 3.8 10*3/uL (ref 1.7–7.7)
Neutrophils Relative %: 50 %
Platelets: 382 10*3/uL (ref 150–400)
RBC: 5.69 MIL/uL — ABNORMAL HIGH (ref 3.87–5.11)
RDW: 14.8 % (ref 11.5–15.5)
WBC: 7.6 10*3/uL (ref 4.0–10.5)
nRBC: 0 % (ref 0.0–0.2)

## 2022-12-30 LAB — COMPREHENSIVE METABOLIC PANEL
ALT: 37 U/L (ref 0–44)
AST: 25 U/L (ref 15–41)
Albumin: 3.9 g/dL (ref 3.5–5.0)
Alkaline Phosphatase: 81 U/L (ref 38–126)
Anion gap: 11 (ref 5–15)
BUN: 20 mg/dL (ref 8–23)
CO2: 25 mmol/L (ref 22–32)
Calcium: 9.5 mg/dL (ref 8.9–10.3)
Chloride: 105 mmol/L (ref 98–111)
Creatinine, Ser: 1.02 mg/dL — ABNORMAL HIGH (ref 0.44–1.00)
GFR, Estimated: 58 mL/min — ABNORMAL LOW (ref >60)
Glucose, Bld: 151 mg/dL — ABNORMAL HIGH (ref 70–99)
Potassium: 4.1 mmol/L (ref 3.5–5.1)
Sodium: 141 mmol/L (ref 135–145)
Total Bilirubin: 0.9 mg/dL (ref 0.3–1.2)
Total Protein: 7 g/dL (ref 6.5–8.1)

## 2022-12-30 NOTE — Assessment & Plan Note (Addendum)
#   RIGHT BREAST IPISILATERAL RECURRENCE- STAGE I Breast cancer ER/PR positive HER-2/neu negative- currently on Ext.AI- letrozole [since April 2014].  stable    # Clinically no evidence of recurrence. Continue letrozole for now- extended  [until 2024 spring]; and then STOP. stable  LEFT mammo-FEB 2024-wnl.   # Microcytic anemia- hemoglobin 10-11-suspect thalassemia.  Asymptomatic; stable   # BMD- T-score of -0.1.  [FEB [id number]- Continue calcium plus vitamin D-stable   # DM- BG 151 fasting; HbA1C- 10.1 [April 2023] - on ozempic- improved/ stable- Encouraged the patient to keep losing weight.  # CKD- Stage III- 56- discussed related to Diabetes.- stable   Counseled the patient regarding the importance of weight loss/better control of diabetes.  Patient seems motivated.  See above.   # DISPOSITION: # follow up in 6 months-  -MD; labs-cbc/cmp-;Dr.B

## 2022-12-30 NOTE — Progress Notes (Signed)
[facility] OFFICE PROGRESS NOTE  Patient Care Team: Tracie Harrier, [name], [name], MD as Consulting Physician (Hematology and Oncology)   Cancer Staging  No matching staging information was found for the patient.   Oncology History Overview Note  # 1996- Right breast. 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, [name], [name], [name]; [name], 2014) 7. Patient was started on Cytoxan and Taxotere January 11, 2013; Patient is on letrozole from April of 2014  # NOV 2016- BMD- Normal.  3. BRCA negative (verbal information from [place])  # mild anemia- ? Thalassemia- Hb ~11; [name]      ;  GOALS: cure  CURRENT/MOST RECENT THERAPY : a letrozole       Carcinoma of overlapping sites of right breast in female, estrogen receptor positive ([place])    INTERVAL HISTORY: Ambulating independently.  Alone.  Nancy Gaines 77 y.o.  female pleasant patient above history of Breast cancer ER/PR positive HER-2/neu negative stage I- currently on Extended AI- Letrozole is here for  follow-up/ review results of the mammogram/BMD.   Patient is currently on Ozempic. States her Blood sugars better.   Patient denies any unusual lumps or bumps.  Admits to compliance with her letrozole.  No nausea no vomiting.  No worsening back pain.     Review of Systems  Constitutional:  Negative for chills, diaphoresis, fever, [name], hemoptysis, sputum production, [name], palpitations, orthopnea and leg swelling.  Gastrointestinal:  Negative for abdominal pain, blood in stool, constipation, diarrhea, heartburn, melena, nausea and vomiting.  Genitourinary:  Negative for dysuria, frequency and urgency.  Musculoskeletal:  Positive for joint pain. Negative for back pain.  Skin: Negative.  Negative for itching and rash.  Neurological:  Negative for dizziness, tingling, focal weakness, weakness and headaches.  Endo/Heme/Allergies:  Does not bruise/bleed easily.  Psychiatric/Behavioral:  Negative for depression. The patient is not nervous/anxious and does not have insomnia.      PAST MEDICAL HISTORY :  Past Medical History:  Diagnosis Date   Anemia    Breast cancer ([place]) 2014   right breast ca- chemo and mastectomy   Cancer ([employer]) 1996   right breast cancer- radiation and lumpectomy   Degenerative joint disease    Dermatophytosis of nail    Diabetes mellitus without complication ([place])    Diverticulosis    Hypercholesterolemia    Hypertension    Personal  history of chemotherapy 2014   BREAST CA   Personal history of radiation therapy 1996   BREAST CA    PAST SURGICAL HISTORY :   Past Surgical History:  Procedure Laterality Date   ABDOMINAL HYSTERECTOMY     COLONOSCOPY WITH PROPOFOL N/A 08/29/2015   Procedure: COLONOSCOPY WITH PROPOFOL;  [name], MD;   Location: [facility] ENDOSCOPY;  Service: Gastroenterology;  [name];   DILATION AND CURETTAGE, DIAGNOSTIC / THERAPEUTIC     MASTECTOMY Right 2014   BREAST CA    FAMILY HISTORY :   Family History  Problem Relation Age of Onset   Diabetes Sister    Breast cancer Sister 38   Breast cancer Maternal Aunt    Breast cancer Paternal Aunt     SOCIAL HISTORY:   Social History   Tobacco Use   Smoking status: Never   Smokeless tobacco: Never  Vaping Use   Vaping Use: Never used  Substance Use Topics   Alcohol use: No   Drug use: No    ALLERGIES:  is allergic to lipitor [atorvastatin].  MEDICATIONS:  Current Outpatient Medications  Medication Sig Dispense Refill   aspirin 81 MG tablet Take 81 mg by mouth daily.     Calcium Carb-Cholecalciferol 600-20 MG-MCG TABS Take 1 tablet by mouth daily.     ferrous sulfate 325 (65 FE) MG tablet Take 325 mg by mouth daily with breakfast.      glimepiride (AMARYL) 4 MG tablet Take 4 mg by mouth 2 (two) times daily.     letrozole (FEMARA) 2.5 MG tablet Take 1 tablet (2.5 mg total) by mouth daily. 90 tablet 3   levothyroxine (SYNTHROID) 50 MCG tablet Take 50 mcg by mouth daily.     losartan-hydrochlorothiazide (HYZAAR) 100-12.5 MG tablet Take 1 tablet by mouth daily.     metFORMIN (GLUCOPHAGE) 1000 MG tablet Take 1,000 mg by mouth 2 (two) times daily with a meal.      Multiple Vitamin (MULTI-VITAMINS) TABS Take by mouth.     NOVOLIN N 100 UNIT/ML injection Inject 35 Units into the skin daily before breakfast. And 20 units in the evening with dinner     OZEMPIC, 0.25 OR 0.5 MG/DOSE, 2 MG/3ML SOPN Inject into the skin.     pravastatin (PRAVACHOL) 20 MG tablet Take 1 tablet by mouth at bedtime.     glucose blood test strip Use once daily. Use as instructed. (Patient not taking: Reported on 03/09/2022)     Zoster Vaccine Adjuvanted [facility]) injection Inject into the muscle. 1 each 1   No current facility-administered medications for this visit.    Facility-Administered Medications Ordered in Other Visits  Medication Dose Route Frequency Provider Last Rate Last Admin   heparin lock flush 100 unit/mL  500 Units Intravenous Once Charlaine Dalton R, [name], [name], [name], [name] mL Intravenous PRN Cammie Sickle, MD        PHYSICAL EXAMINATION: ECOG PERFORMANCE STATUS: 0 - Asymptomatic  BP 124/80 (BP Location: Left Arm, Patient Position:  Sitting)   Pulse 89   Temp (!) 97 F (36.1 C) (Tympanic)   Resp 16   Wt 218 lb (98.9 kg)   BMI 39.87 kg/m   Filed Weights   12/30/22 1000  Weight: 218 lb (98.9 kg)     Physical Exam HENT:     Head: Normocephalic and atraumatic.     Mouth/Throat:     Pharynx: No oropharyngeal exudate.  Eyes:     Pupils: Pupils are equal, round, and reactive to light.  Cardiovascular:     Rate and Rhythm: Normal rate and regular rhythm.  Pulmonary:     Effort: No respiratory distress.     Breath sounds: No wheezing.  Abdominal:     General: Bowel sounds are normal. There is no distension.     Palpations: Abdomen is soft. There is no mass.     Tenderness: There is no abdominal tenderness. There is no guarding or rebound.  Musculoskeletal:        General: No tenderness. Normal range of motion.     Cervical back: Normal range of motion and neck supple.  Skin:    General: Skin is warm.  Neurological:     Mental Status: She is alert and oriented to person, place, and time.  Psychiatric:        Mood and Affect: Affect normal.     .  LABORATORY DATA:  I have reviewed the data as listed    Component Value Date/Time   NA 141 12/30/2022 0956   NA 141  05/06/2014 1039   K 4.1 12/30/2022 0956   K 4.4 05/06/2014 1039   CL 105 12/30/2022 0956   CL 104 05/06/2014 1039   CO2 25 12/30/2022 0956   CO2 30 05/06/2014 1039   GLUCOSE 151 (H) 12/30/2022 0956   GLUCOSE 184 (H) 05/06/2014 1039   BUN 20 12/30/2022 0956   BUN 21 (H) 05/06/2014 1039   CREATININE 1.02 (H) 12/30/2022 0956   CREATININE 1.04 05/06/2014 1039   CALCIUM 9.5 12/30/2022 0956   CALCIUM 9.7 05/06/2014 1039   PROT 7.0 12/30/2022 0956   PROT 7.3 05/06/2014 1039   ALBUMIN 3.9 12/30/2022 0956   ALBUMIN 3.7 05/06/2014 1039   AST 25 12/30/2022 0956   AST 21 05/06/2014 1039   ALT 37 12/30/2022 0956   ALT 63 05/06/2014 1039   ALKPHOS 81 12/30/2022 0956   ALKPHOS 102 05/06/2014 1039   BILITOT 0.9 12/30/2022 0956   BILITOT 0.7 05/06/2014 1039   GFRNONAA 58 (L) 12/30/2022 0956   GFRNONAA 56 (L) 05/06/2014 1039   GFRAA >60 06/05/2020 1012   GFRAA >60 05/06/2014 1039    No results found for: "SPEP", "UPEP"  Lab Results  Component Value Date   WBC 7.6 12/30/2022   NEUTROABS 3.8 12/30/2022   HGB 11.9 (L) 12/30/2022   HCT 39.4 12/30/2022   MCV 69.2 (L) 12/30/2022   PLT 382 12/30/2022      Chemistry      Component Value Date/Time   NA 141 12/30/2022 0956   NA 141 05/06/2014 1039   K 4.1 12/30/2022 0956   K 4.4 05/06/2014 1039   CL 105 12/30/2022 0956   CL 104 05/06/2014 1039   CO2 25 12/30/2022 0956   CO2 30 05/06/2014 1039   BUN 20 12/30/2022 0956   BUN 21 (H) 05/06/2014 1039   CREATININE 1.02 (H) 12/30/2022 0956   CREATININE 1.04 05/06/2014 1039      Component Value Date/Time   CALCIUM  9.5 12/30/2022 0956   CALCIUM 9.7 05/06/2014 1039   ALKPHOS 81 12/30/2022 0956   ALKPHOS 102 05/06/2014 1039   AST 25 12/30/2022 0956   AST 21 05/06/2014 1039   ALT 37 12/30/2022 0956   ALT 63 05/06/2014 1039   BILITOT 0.9 12/30/2022 0956   BILITOT 0.7 05/06/2014 1039      RADIOGRAPHIC STUDIES: I have personally reviewed the radiological images as listed and agreed  with the findings in the report. No results found.   ASSESSMENT & PLAN:  Carcinoma of overlapping sites of right breast in female, estrogen receptor positive ([place]) # RIGHT BREAST IPISILATERAL RECURRENCE- STAGE I Breast cancer ER/PR positive HER-2/neu negative- currently on Ext.AI- letrozole [since April 2014].  stable    # Clinically no evidence of recurrence. Continue letrozole for now- extended  [until 2024 spring]; and then STOP. stable  LEFT mammo-FEB 2024-wnl.   # Microcytic anemia- hemoglobin 10-11-suspect thalassemia.  Asymptomatic; stable   # BMD- T-score of -0.1.  [FEB [id number]- Continue calcium plus vitamin D-stable   # DM- BG 151 fasting; HbA1C- 10.1 [April 2023] - on ozempic-  Encouraged the patient to keep losing weight.  # CKD- Stage III- 58- discussed related to Diabetes.- stable   Counseled the patient regarding the importance of weight loss/better control of diabetes.  Patient seems motivated.  See above.   # DISPOSITION: # follow up in 6 months-  -MD; labs-cbc/cmp-;Dr.B   Orders Placed This Encounter  Procedures   CBC with Differential ([facility] Only)    Standing Status:   Future    Standing Expiration Date:   11/30/2023   CMP ([facility] only)    Standing Status:   Future    Standing Expiration Date:   12/30/2023   All questions were answered. The patient knows to call the clinic with any problems, questions or concerns.     Cammie Sickle, MD 12/30/2022 11:08 AM

## 2022-12-30 NOTE — Progress Notes (Signed)
Patient denies new problems/concerns today.   °

## 2023-01-22 ENCOUNTER — Other Ambulatory Visit: Payer: Self-pay | Admitting: Internal Medicine

## 2023-02-05 IMAGING — US US BREAST*R* LIMITED INC AXILLA
1 series · 2 of 2 positions shown · non-contrast
Comparison: Previous exams.

CLINICAL DATA: 75-year-old female presenting for new tenderness
along her mastectomy scar. Patient has a history of right breast
cancer status post mastectomy in 2877. Per the physician report
there is a possible small pea-sized mass at the site of pain.

EXAM:
ULTRASOUND OF THE RIGHT BREAST

[Series 1: us breast*right* limited inc axilla · 0.06mm/px · 2 of 2 slices shown]
[im 1/2]
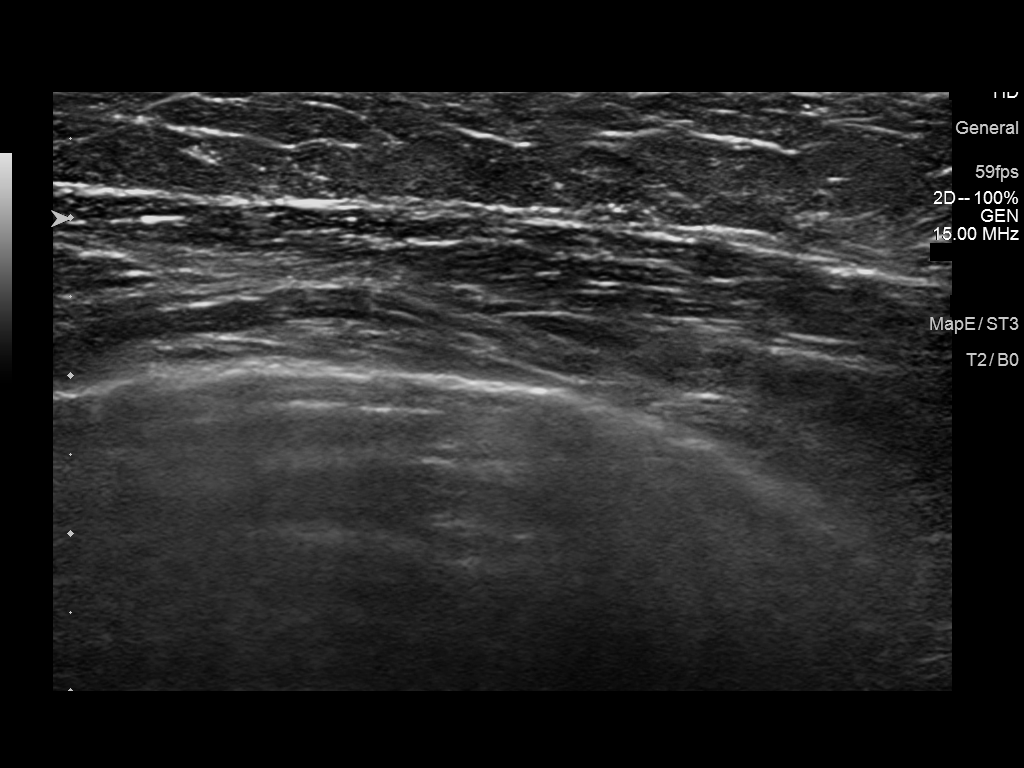
[im 2/2]
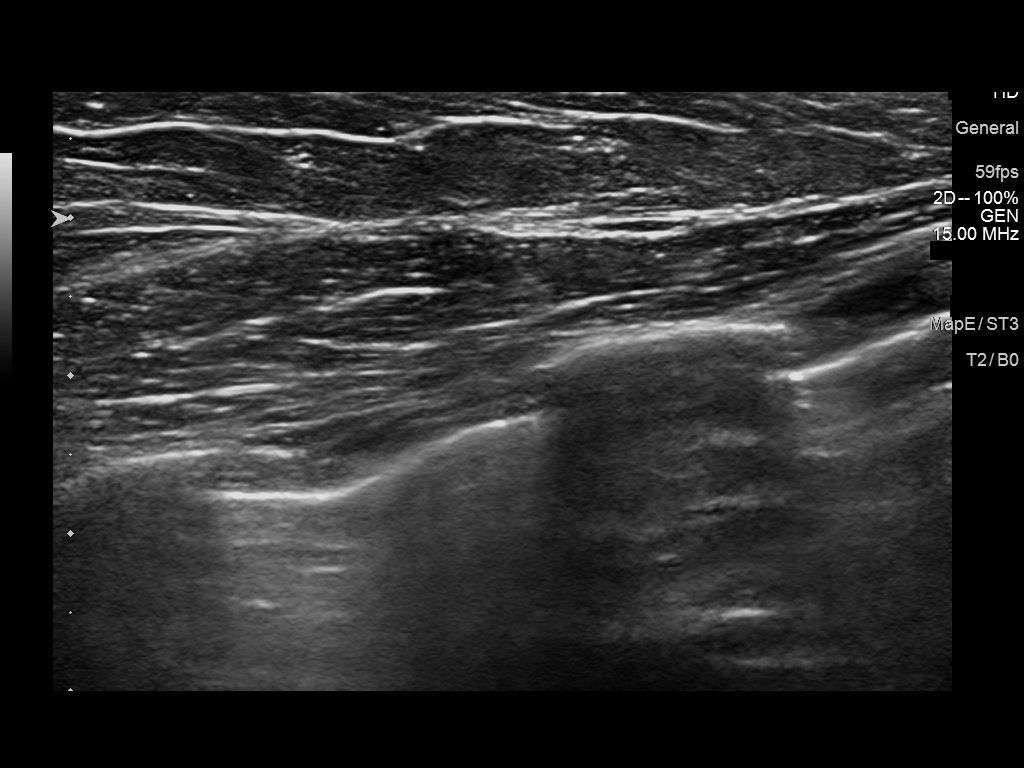

[2 of 2 positions shown; findings below may reference images not displayed]

FINDINGS: On physical exam, I do not feel a discrete mass along the mastectomy
scar. There is smooth thickening most consistent with scar tissue.

Targeted ultrasound is performed at the site of pain/palpable area
along the mastectomy scar demonstrating no cystic or solid mass. No
focal fluid collection.
IMPRESSION: No sonographic abnormality at the site of pain along the right
mastectomy scar.

RECOMMENDATION:
1. Recommend any further workup of the painful/palpable site be on a
clinical basis.

2. Return for routine screening of the left breast in December 2022.

I have discussed the findings and recommendations with the patient.
If applicable, a reminder letter will be sent to the patient
regarding the next appointment.

BI-RADS CATEGORY  1: Negative.

## 2023-02-15 DIAGNOSIS — Z4432 Encounter for fitting and adjustment of external left breast prosthesis: Secondary | ICD-10-CM | POA: Diagnosis not present

## 2023-02-15 DIAGNOSIS — Z9012 Acquired absence of left breast and nipple: Secondary | ICD-10-CM | POA: Diagnosis not present

## 2023-02-15 DIAGNOSIS — C50812 Malignant neoplasm of overlapping sites of left female breast: Secondary | ICD-10-CM | POA: Diagnosis not present

## 2023-03-09 ENCOUNTER — Other Ambulatory Visit: Payer: Self-pay

## 2023-03-10 ENCOUNTER — Other Ambulatory Visit: Payer: Self-pay

## 2023-03-18 DIAGNOSIS — R8290 Unspecified abnormal findings in urine: Secondary | ICD-10-CM | POA: Diagnosis not present

## 2023-03-18 DIAGNOSIS — D649 Anemia, unspecified: Secondary | ICD-10-CM | POA: Diagnosis not present

## 2023-03-18 DIAGNOSIS — Z9181 History of falling: Secondary | ICD-10-CM | POA: Diagnosis not present

## 2023-03-18 DIAGNOSIS — Z6839 Body mass index (BMI) 39.0-39.9, adult: Secondary | ICD-10-CM | POA: Diagnosis not present

## 2023-03-18 DIAGNOSIS — C50812 Malignant neoplasm of overlapping sites of left female breast: Secondary | ICD-10-CM | POA: Diagnosis not present

## 2023-03-18 DIAGNOSIS — I10 Essential (primary) hypertension: Secondary | ICD-10-CM | POA: Diagnosis not present

## 2023-03-18 DIAGNOSIS — E1165 Type 2 diabetes mellitus with hyperglycemia: Secondary | ICD-10-CM | POA: Diagnosis not present

## 2023-03-18 DIAGNOSIS — M25562 Pain in left knee: Secondary | ICD-10-CM | POA: Diagnosis not present

## 2023-03-18 DIAGNOSIS — R7989 Other specified abnormal findings of blood chemistry: Secondary | ICD-10-CM | POA: Diagnosis not present

## 2023-03-18 DIAGNOSIS — M25511 Pain in right shoulder: Secondary | ICD-10-CM | POA: Diagnosis not present

## 2023-03-18 DIAGNOSIS — E119 Type 2 diabetes mellitus without complications: Secondary | ICD-10-CM | POA: Diagnosis not present

## 2023-03-25 ENCOUNTER — Other Ambulatory Visit: Payer: Self-pay

## 2023-03-25 DIAGNOSIS — R5381 Other malaise: Secondary | ICD-10-CM

## 2023-03-25 DIAGNOSIS — Z0000 Encounter for general adult medical examination without abnormal findings: Secondary | ICD-10-CM | POA: Diagnosis not present

## 2023-03-25 DIAGNOSIS — E669 Obesity, unspecified: Secondary | ICD-10-CM | POA: Diagnosis not present

## 2023-03-25 DIAGNOSIS — M25511 Pain in right shoulder: Secondary | ICD-10-CM | POA: Diagnosis not present

## 2023-03-25 DIAGNOSIS — E119 Type 2 diabetes mellitus without complications: Secondary | ICD-10-CM | POA: Diagnosis not present

## 2023-03-25 DIAGNOSIS — M1712 Unilateral primary osteoarthritis, left knee: Secondary | ICD-10-CM | POA: Diagnosis not present

## 2023-03-25 DIAGNOSIS — E039 Hypothyroidism, unspecified: Secondary | ICD-10-CM | POA: Diagnosis not present

## 2023-03-25 DIAGNOSIS — Z6838 Body mass index (BMI) 38.0-38.9, adult: Secondary | ICD-10-CM | POA: Diagnosis not present

## 2023-03-25 DIAGNOSIS — Z9181 History of falling: Secondary | ICD-10-CM

## 2023-03-25 DIAGNOSIS — I10 Essential (primary) hypertension: Secondary | ICD-10-CM | POA: Diagnosis not present

## 2023-03-25 DIAGNOSIS — D630 Anemia in neoplastic disease: Secondary | ICD-10-CM | POA: Diagnosis not present

## 2023-04-01 ENCOUNTER — Ambulatory Visit
Admission: RE | Admit: 2023-04-01 | Discharge: 2023-04-01 | Disposition: A | Discharge status: Home / Self Care | Payer: Medicare HMO | Source: Ambulatory Visit | Attending: Internal Medicine | Admitting: Internal Medicine

## 2023-04-01 DIAGNOSIS — R5383 Other fatigue: Secondary | ICD-10-CM | POA: Diagnosis not present

## 2023-04-01 DIAGNOSIS — R5381 Other malaise: Secondary | ICD-10-CM | POA: Diagnosis not present

## 2023-04-01 DIAGNOSIS — M25511 Pain in right shoulder: Secondary | ICD-10-CM | POA: Insufficient documentation

## 2023-04-01 DIAGNOSIS — Z9181 History of falling: Secondary | ICD-10-CM | POA: Insufficient documentation

## 2023-04-04 DIAGNOSIS — R251 Tremor, unspecified: Secondary | ICD-10-CM | POA: Diagnosis not present

## 2023-04-11 DIAGNOSIS — H6122 Impacted cerumen, left ear: Secondary | ICD-10-CM | POA: Diagnosis not present

## 2023-04-11 DIAGNOSIS — H902 Conductive hearing loss, unspecified: Secondary | ICD-10-CM | POA: Diagnosis not present

## 2023-04-12 DIAGNOSIS — M1712 Unilateral primary osteoarthritis, left knee: Secondary | ICD-10-CM | POA: Diagnosis not present

## 2023-04-12 DIAGNOSIS — M25562 Pain in left knee: Secondary | ICD-10-CM | POA: Diagnosis not present

## 2023-04-12 DIAGNOSIS — Z794 Long term (current) use of insulin: Secondary | ICD-10-CM | POA: Diagnosis not present

## 2023-04-12 DIAGNOSIS — E1165 Type 2 diabetes mellitus with hyperglycemia: Secondary | ICD-10-CM | POA: Diagnosis not present

## 2023-04-12 DIAGNOSIS — Z6841 Body Mass Index (BMI) 40.0 and over, adult: Secondary | ICD-10-CM | POA: Diagnosis not present

## 2023-04-19 DIAGNOSIS — M25511 Pain in right shoulder: Secondary | ICD-10-CM | POA: Diagnosis not present

## 2023-04-19 DIAGNOSIS — M67813 Other specified disorders of tendon, right shoulder: Secondary | ICD-10-CM | POA: Diagnosis not present

## 2023-04-19 DIAGNOSIS — M7591 Shoulder lesion, unspecified, right shoulder: Secondary | ICD-10-CM | POA: Diagnosis not present

## 2023-04-25 DIAGNOSIS — M75121 Complete rotator cuff tear or rupture of right shoulder, not specified as traumatic: Secondary | ICD-10-CM | POA: Diagnosis not present

## 2023-05-03 DIAGNOSIS — M1712 Unilateral primary osteoarthritis, left knee: Secondary | ICD-10-CM | POA: Diagnosis not present

## 2023-05-23 DIAGNOSIS — S46011D Strain of muscle(s) and tendon(s) of the rotator cuff of right shoulder, subsequent encounter: Secondary | ICD-10-CM | POA: Diagnosis not present

## 2023-06-01 DIAGNOSIS — N1831 Chronic kidney disease, stage 3a: Secondary | ICD-10-CM | POA: Diagnosis not present

## 2023-06-01 DIAGNOSIS — E1165 Type 2 diabetes mellitus with hyperglycemia: Secondary | ICD-10-CM | POA: Diagnosis not present

## 2023-06-01 DIAGNOSIS — I10 Essential (primary) hypertension: Secondary | ICD-10-CM | POA: Diagnosis not present

## 2023-06-15 DIAGNOSIS — S46011D Strain of muscle(s) and tendon(s) of the rotator cuff of right shoulder, subsequent encounter: Secondary | ICD-10-CM | POA: Diagnosis not present

## 2023-06-24 DIAGNOSIS — S46011D Strain of muscle(s) and tendon(s) of the rotator cuff of right shoulder, subsequent encounter: Secondary | ICD-10-CM | POA: Diagnosis not present

## 2023-06-30 ENCOUNTER — Inpatient Hospital Stay: Payer: Medicare HMO | Attending: Internal Medicine

## 2023-06-30 ENCOUNTER — Inpatient Hospital Stay: Payer: Medicare HMO | Admitting: Internal Medicine

## 2023-06-30 ENCOUNTER — Encounter: Payer: Self-pay | Admitting: Internal Medicine

## 2023-06-30 VITALS — BP 170/88 | HR 80 | Temp 98.6°F | Wt 214.6 lb

## 2023-06-30 DIAGNOSIS — Z79811 Long term (current) use of aromatase inhibitors: Secondary | ICD-10-CM | POA: Diagnosis not present

## 2023-06-30 DIAGNOSIS — Z923 Personal history of irradiation: Secondary | ICD-10-CM | POA: Insufficient documentation

## 2023-06-30 DIAGNOSIS — Z7984 Long term (current) use of oral hypoglycemic drugs: Secondary | ICD-10-CM | POA: Insufficient documentation

## 2023-06-30 DIAGNOSIS — Z9011 Acquired absence of right breast and nipple: Secondary | ICD-10-CM | POA: Insufficient documentation

## 2023-06-30 DIAGNOSIS — Z170 Estrogen receptor positive status [ER+]: Secondary | ICD-10-CM | POA: Insufficient documentation

## 2023-06-30 DIAGNOSIS — Z794 Long term (current) use of insulin: Secondary | ICD-10-CM | POA: Diagnosis not present

## 2023-06-30 DIAGNOSIS — Z7982 Long term (current) use of aspirin: Secondary | ICD-10-CM | POA: Insufficient documentation

## 2023-06-30 DIAGNOSIS — I129 Hypertensive chronic kidney disease with stage 1 through stage 4 chronic kidney disease, or unspecified chronic kidney disease: Secondary | ICD-10-CM | POA: Diagnosis not present

## 2023-06-30 DIAGNOSIS — E1122 Type 2 diabetes mellitus with diabetic chronic kidney disease: Secondary | ICD-10-CM | POA: Diagnosis not present

## 2023-06-30 DIAGNOSIS — Z79899 Other long term (current) drug therapy: Secondary | ICD-10-CM | POA: Diagnosis not present

## 2023-06-30 DIAGNOSIS — C50811 Malignant neoplasm of overlapping sites of right female breast: Secondary | ICD-10-CM

## 2023-06-30 DIAGNOSIS — N183 Chronic kidney disease, stage 3 unspecified: Secondary | ICD-10-CM | POA: Diagnosis not present

## 2023-06-30 DIAGNOSIS — D509 Iron deficiency anemia, unspecified: Secondary | ICD-10-CM | POA: Diagnosis not present

## 2023-06-30 DIAGNOSIS — Z90710 Acquired absence of both cervix and uterus: Secondary | ICD-10-CM | POA: Diagnosis not present

## 2023-06-30 LAB — CBC WITH DIFFERENTIAL (CANCER CENTER ONLY)
Abs Immature Granulocytes: 0.03 10*3/uL (ref 0.00–0.07)
Basophils Absolute: 0 10*3/uL (ref 0.0–0.1)
Basophils Relative: 1 %
Eosinophils Absolute: 0.1 10*3/uL (ref 0.0–0.5)
Eosinophils Relative: 2 %
HCT: 37.9 % (ref 36.0–46.0)
Hemoglobin: 11.6 g/dL — ABNORMAL LOW (ref 12.0–15.0)
Immature Granulocytes: 0 %
Lymphocytes Relative: 37 %
Lymphs Abs: 2.7 10*3/uL (ref 0.7–4.0)
MCH: 21.3 pg — ABNORMAL LOW (ref 26.0–34.0)
MCHC: 30.6 g/dL (ref 30.0–36.0)
MCV: 69.7 fL — ABNORMAL LOW (ref 80.0–100.0)
Monocytes Absolute: 0.4 10*3/uL (ref 0.1–1.0)
Monocytes Relative: 6 %
Neutro Abs: 4 10*3/uL (ref 1.7–7.7)
Neutrophils Relative %: 54 %
Platelet Count: 332 10*3/uL (ref 150–400)
RBC: 5.44 MIL/uL — ABNORMAL HIGH (ref 3.87–5.11)
RDW: 14.8 % (ref 11.5–15.5)
WBC Count: 7.3 10*3/uL (ref 4.0–10.5)
nRBC: 0 % (ref 0.0–0.2)

## 2023-06-30 LAB — CMP (CANCER CENTER ONLY)
ALT: 36 U/L (ref 0–44)
AST: 23 U/L (ref 15–41)
Albumin: 4 g/dL (ref 3.5–5.0)
Alkaline Phosphatase: 79 U/L (ref 38–126)
Anion gap: 6 (ref 5–15)
BUN: 22 mg/dL (ref 8–23)
CO2: 25 mmol/L (ref 22–32)
Calcium: 9.3 mg/dL (ref 8.9–10.3)
Chloride: 109 mmol/L (ref 98–111)
Creatinine: 1.02 mg/dL — ABNORMAL HIGH (ref 0.44–1.00)
GFR, Estimated: 57 mL/min — ABNORMAL LOW (ref >60)
Glucose, Bld: 188 mg/dL — ABNORMAL HIGH (ref 70–99)
Potassium: 4.3 mmol/L (ref 3.5–5.1)
Sodium: 140 mmol/L (ref 135–145)
Total Bilirubin: 0.6 mg/dL (ref 0.3–1.2)
Total Protein: 6.7 g/dL (ref 6.5–8.1)

## 2023-06-30 NOTE — Addendum Note (Signed)
Addended by: Darrold Span A on: 06/30/2023 10:39 AM   Modules accepted: Orders

## 2023-06-30 NOTE — Assessment & Plan Note (Addendum)
#   RIGHT BREAST IPISILATERAL RECURRENCE- STAGE I Breast cancer ER/PR positive HER-2/neu negative- currently s/p Ext.AI- letrozole [finished 2024-].  stable    # Clinically no evidence of recurrence.  UNI LAT LEFT mammo-FEB 2024-wnl.   # Microcytic anemia- hemoglobin 10-11-suspect thalassemia.  Asymptomatic; stable   # BMD- T-score of -0.1.  [FEB 2024]- Continue calcium plus vitamin D-stable   # DM- BG 188 fasting; HbA1C- 10.1 [April 2023] - on ozempic- improved/ stable- Encouraged the patient to keep losing weight.  # CKD- Stage III- 59 discussed related to Diabetes.- stable   Counseled the patient regarding the importance of weight loss/better control of diabetes.  Stable.   # DISPOSITION: # UNI LAT LEFT mammo-FEB 2024 # follow up in 6  months-  -MD; labs-cbc/cmp; vit D 25-OH-;Dr.B

## 2023-06-30 NOTE — Progress Notes (Signed)
[facility] OFFICE PROGRESS NOTE  Patient Care Team: Barbette Reichmann, MD as PCP - General (Internal Medicine) Carolan Shiver, MD as Consulting Physician (General Surgery) Earna Coder, MD as Consulting Physician (Hematology and Oncology)   Cancer Staging  No matching staging information was found for the patient.    Oncology History Overview Note  # 1996- Right breast. 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, [name], [name], [name]; [name], 2014) 7. Patient was started on Cytoxan and Taxotere January 11, 2013; Patient is on letrozole from April of 2014  # NOV 2016- BMD- Normal.  3. BRCA negative (verbal information from [facility])  # mild anemia- ? Thalassemia- Hb ~11; [name]      ;  GOALS: cure  CURRENT/MOST RECENT THERAPY : a letrozole       Carcinoma of overlapping sites of right breast in female, estrogen receptor positive ([facility])    INTERVAL HISTORY: Ambulating independently.  Alone.  Nancy Gaines 77 y.o.  female pleasant patient above history of Breast cancer ER/PR positive HER-2/neu negative stage I-status post extended AI- Letrozole [finished  April 2024] currently on surveillance is here for follow-up.   Patient is currently on Ozempic. States her Blood sugars better.  However still struggling to lose weight.  Denies any new lumps or bumps.  Patient denies any unusual lumps or bumps.  No nausea no vomiting.  No worsening back pain.    Review of Systems  Constitutional:  Negative for chills, diaphoresis, fever, [name], hemoptysis, sputum production, [name], palpitations, orthopnea and leg swelling.  Gastrointestinal:  Negative for abdominal pain, blood in stool, constipation, diarrhea, heartburn, melena, nausea and vomiting.  Genitourinary:  Negative for dysuria, frequency and urgency.  Musculoskeletal:  Positive for joint pain. Negative for back pain.  Skin: Negative.  Negative for itching and rash.  Neurological:  Negative for dizziness, tingling, focal weakness, weakness and headaches.  Endo/Heme/Allergies:  Does not bruise/bleed easily.  Psychiatric/Behavioral:  Negative for depression. The patient is not nervous/anxious and does not have insomnia.      PAST MEDICAL HISTORY :  Past Medical History:  Diagnosis Date   Anemia    Breast cancer ([facility]) 2014   right breast ca- chemo and mastectomy   Cancer ([facility]) 1996   right breast cancer- radiation and lumpectomy   Degenerative joint disease    Dermatophytosis of nail    Diabetes mellitus without complication ([facility])    Diverticulosis    Hypercholesterolemia  Hypertension    Personal history of chemotherapy 2014   BREAST CA   Personal history of radiation therapy 1996   BREAST CA    PAST SURGICAL HISTORY :   Past Surgical History:  Procedure Laterality Date   ABDOMINAL HYSTERECTOMY     COLONOSCOPY WITH PROPOFOL N/A 08/29/2015   Procedure: COLONOSCOPY  WITH PROPOFOL;  Surgeon: Wallace Cullens, MD;  Location: [facility] ENDOSCOPY;  Service: Gastroenterology;  [name];   DILATION AND CURETTAGE, DIAGNOSTIC / THERAPEUTIC     MASTECTOMY Right 2014   BREAST CA    FAMILY HISTORY :   Family History  Problem Relation Age of Onset   Diabetes Sister    Breast cancer Sister 59   Breast cancer Maternal Aunt    Breast cancer Paternal Aunt     SOCIAL HISTORY:   Social History   Tobacco Use   Smoking status: Never   Smokeless tobacco: Never  Vaping Use   Vaping status: Never Used  Substance Use Topics   Alcohol use: No   Drug use: No    ALLERGIES:  is allergic to lipitor [atorvastatin].  MEDICATIONS:  Current Outpatient Medications  Medication Sig Dispense Refill   aspirin 81 MG tablet Take 81 mg by mouth daily.     Calcium Carb-Cholecalciferol 600-20 MG-MCG TABS Take 1 tablet by mouth daily.     ferrous sulfate 325 (65 FE) MG tablet Take 325 mg by mouth daily with breakfast.      glimepiride (AMARYL) 4 MG tablet Take 4 mg by mouth 2 (two) times daily.     glucose blood test strip      letrozole (FEMARA) 2.5 MG tablet Take 1 tablet (2.5 mg total) by mouth daily. 90 tablet 3   levothyroxine (SYNTHROID) 50 MCG tablet Take 50 mcg by mouth daily.     losartan-hydrochlorothiazide (HYZAAR) 100-12.5 MG tablet Take 1 tablet by mouth daily.     metFORMIN (GLUCOPHAGE) 1000 MG tablet Take 1,000 mg by mouth 2 (two) times daily with a meal.      Multiple Vitamin (MULTI-VITAMINS) TABS Take by mouth.     NOVOLIN N 100 UNIT/ML injection Inject 35 Units into the skin daily before breakfast. And 20 units in the evening with dinner     OZEMPIC, 0.25 OR 0.5 MG/DOSE, 2 MG/3ML SOPN Inject into the skin.     pravastatin (PRAVACHOL) 20 MG tablet Take 1 tablet by mouth at bedtime.     Zoster Vaccine Adjuvanted [facility]) injection Inject into the muscle. 1 each 1   No current facility-administered medications for this visit.   Facility-Administered  Medications Ordered in Other Visits  Medication Dose Route Frequency Provider Last Rate Last Admin   heparin lock flush 100 unit/mL  500 Units Intravenous Once Louretta Shorten R, MD       heparin lock flush 100 unit/mL  500 Units Intravenous Once Louretta Shorten R, [name] mL Intravenous Once Louretta Shorten R, [name] mL Intravenous Once Louretta Shorten R, [name] mL Intravenous PRN Earna Coder, MD        PHYSICAL EXAMINATION: ECOG PERFORMANCE STATUS: 0 - Asymptomatic  BP (!) 170/88 (BP Location: Left Arm, Patient Position: Sitting)   Pulse 80  Temp 98.6 F (37 C) (Tympanic)   Wt 214 lb 9.6 oz (97.3 kg)   SpO2 98%   BMI 39.25 kg/m   Filed Weights   06/30/23 1000  Weight: 214 lb 9.6 oz (97.3 kg)      Physical Exam HENT:     Head: Normocephalic and atraumatic.     Mouth/Throat:     Pharynx: No oropharyngeal exudate.  Eyes:     Pupils: Pupils are equal, round, and reactive to light.  Cardiovascular:     Rate and Rhythm: Normal rate and regular rhythm.  Pulmonary:     Effort: No respiratory distress.     Breath sounds: No wheezing.  Abdominal:     General: Bowel sounds are normal. There is no distension.     Palpations: Abdomen is soft. There is no mass.     Tenderness: There is no abdominal tenderness. There is no guarding or rebound.  Musculoskeletal:        General: No tenderness. Normal range of motion.     Cervical back: Normal range of motion and neck supple.  Skin:    General: Skin is warm.  Neurological:     Mental Status: She is alert and oriented to person, place, and time.  Psychiatric:        Mood and Affect: Affect normal.     .  LABORATORY DATA:  I have reviewed the data as listed    Component Value Date/Time   NA 140 06/30/2023 0916   NA 141 05/06/2014 1039    K 4.3 06/30/2023 0916   K 4.4 05/06/2014 1039   CL 109 06/30/2023 0916   CL 104 05/06/2014 1039   CO2 25 06/30/2023 0916   CO2 30 05/06/2014 1039   GLUCOSE 188 (H) 06/30/2023 0916   GLUCOSE 184 (H) 05/06/2014 1039   BUN 22 06/30/2023 0916   BUN 21 (H) 05/06/2014 1039   CREATININE 1.02 (H) 06/30/2023 0916   CREATININE 1.04 05/06/2014 1039   CALCIUM 9.3 06/30/2023 0916   CALCIUM 9.7 05/06/2014 1039   PROT 6.7 06/30/2023 0916   PROT 7.3 05/06/2014 1039   ALBUMIN 4.0 06/30/2023 0916   ALBUMIN 3.7 05/06/2014 1039   AST 23 06/30/2023 0916   ALT 36 06/30/2023 0916   ALT 63 05/06/2014 1039   ALKPHOS 79 06/30/2023 0916   ALKPHOS 102 05/06/2014 1039   BILITOT 0.6 06/30/2023 0916   GFRNONAA 57 (L) 06/30/2023 0916   GFRNONAA 56 (L) 05/06/2014 1039   GFRAA >60 06/05/2020 1012   GFRAA >60 05/06/2014 1039    No results found for: "SPEP", "UPEP"  Lab Results  Component Value Date   WBC 7.6 12/30/2022   NEUTROABS 3.8 12/30/2022   HGB 11.9 (L) 12/30/2022   HCT 39.4 12/30/2022   MCV 69.2 (L) 12/30/2022   PLT 382 12/30/2022      Chemistry      Component Value Date/Time   NA 140 06/30/2023 0916   NA 141 05/06/2014 1039   K 4.3 06/30/2023 0916   K 4.4 05/06/2014 1039   CL 109 06/30/2023 0916   CL 104 05/06/2014 1039   CO2 25 06/30/2023 0916   CO2 30 05/06/2014 1039   BUN 22 06/30/2023 0916   BUN 21 (H) 05/06/2014 1039   CREATININE 1.02 (H) 06/30/2023 0916   CREATININE 1.04 05/06/2014 1039      Component Value Date/Time   CALCIUM 9.3 06/30/2023 0916   CALCIUM 9.7 05/06/2014 1039   ALKPHOS 79 06/30/2023 0916  ALKPHOS 102 05/06/2014 1039   AST 23 06/30/2023 0916   ALT 36 06/30/2023 0916   ALT 63 05/06/2014 1039   BILITOT 0.6 06/30/2023 0916      RADIOGRAPHIC STUDIES: I have personally reviewed the radiological images as listed and agreed with the findings in the report. No results found.   ASSESSMENT & PLAN:  Carcinoma of overlapping sites of right breast in  female, estrogen receptor positive ([facility]) # RIGHT BREAST IPISILATERAL RECURRENCE- STAGE I Breast cancer ER/PR positive HER-2/neu negative- currently s/p Ext.AI- letrozole [finished 2024-].  stable    # Clinically no evidence of recurrence.  UNI LAT LEFT mammo-FEB 2024-wnl.   # Microcytic anemia- hemoglobin 10-11-suspect thalassemia.  Asymptomatic; stable   # BMD- T-score of -0.1.  [FEB 2024]- Continue calcium plus vitamin D-stable   # DM- BG 188 fasting; HbA1C- 10.1 [April 2023] - on ozempic- improved/ stable- Encouraged the patient to keep losing weight.  # CKD- Stage III- 59 discussed related to Diabetes.- stable   Counseled the patient regarding the importance of weight loss/better control of diabetes.  Stable.   # DISPOSITION: # UNI LAT LEFT mammo-FEB 2024 # follow up in 6  months-  -MD; labs-cbc/cmp; vit D 25-OH-;Dr.B   No orders of the defined types were placed in this encounter.  All questions were answered. The patient knows to call the clinic with any problems, questions or concerns.     Earna Coder, MD 06/30/2023 10:14 AM

## 2023-06-30 NOTE — Progress Notes (Signed)
Doing well. Occ little stabbing pains from scar tissue in breast. Appetite and energy are good. Continues taking letrozole.

## 2023-08-02 DIAGNOSIS — Z9181 History of falling: Secondary | ICD-10-CM | POA: Diagnosis not present

## 2023-08-02 DIAGNOSIS — H2513 Age-related nuclear cataract, bilateral: Secondary | ICD-10-CM | POA: Diagnosis not present

## 2023-08-02 DIAGNOSIS — H40003 Preglaucoma, unspecified, bilateral: Secondary | ICD-10-CM | POA: Diagnosis not present

## 2023-08-02 DIAGNOSIS — D649 Anemia, unspecified: Secondary | ICD-10-CM | POA: Diagnosis not present

## 2023-08-02 DIAGNOSIS — E113292 Type 2 diabetes mellitus with mild nonproliferative diabetic retinopathy without macular edema, left eye: Secondary | ICD-10-CM | POA: Diagnosis not present

## 2023-08-02 DIAGNOSIS — M1712 Unilateral primary osteoarthritis, left knee: Secondary | ICD-10-CM | POA: Diagnosis not present

## 2023-08-02 DIAGNOSIS — Z6838 Body mass index (BMI) 38.0-38.9, adult: Secondary | ICD-10-CM | POA: Diagnosis not present

## 2023-08-02 DIAGNOSIS — I10 Essential (primary) hypertension: Secondary | ICD-10-CM | POA: Diagnosis not present

## 2023-08-02 DIAGNOSIS — E039 Hypothyroidism, unspecified: Secondary | ICD-10-CM | POA: Diagnosis not present

## 2023-08-02 DIAGNOSIS — M25511 Pain in right shoulder: Secondary | ICD-10-CM | POA: Diagnosis not present

## 2023-08-02 DIAGNOSIS — E1165 Type 2 diabetes mellitus with hyperglycemia: Secondary | ICD-10-CM | POA: Diagnosis not present

## 2023-08-02 DIAGNOSIS — R5381 Other malaise: Secondary | ICD-10-CM | POA: Diagnosis not present

## 2023-08-09 DIAGNOSIS — Z6837 Body mass index (BMI) 37.0-37.9, adult: Secondary | ICD-10-CM | POA: Diagnosis not present

## 2023-08-09 DIAGNOSIS — Z13820 Encounter for screening for osteoporosis: Secondary | ICD-10-CM | POA: Diagnosis not present

## 2023-08-09 DIAGNOSIS — I10 Essential (primary) hypertension: Secondary | ICD-10-CM | POA: Diagnosis not present

## 2023-08-09 DIAGNOSIS — E119 Type 2 diabetes mellitus without complications: Secondary | ICD-10-CM | POA: Diagnosis not present

## 2023-08-09 DIAGNOSIS — E669 Obesity, unspecified: Secondary | ICD-10-CM | POA: Diagnosis not present

## 2023-08-09 DIAGNOSIS — Z23 Encounter for immunization: Secondary | ICD-10-CM | POA: Diagnosis not present

## 2023-08-09 DIAGNOSIS — Z794 Long term (current) use of insulin: Secondary | ICD-10-CM | POA: Diagnosis not present

## 2023-09-19 DIAGNOSIS — I10 Essential (primary) hypertension: Secondary | ICD-10-CM | POA: Diagnosis not present

## 2023-09-19 DIAGNOSIS — N1831 Chronic kidney disease, stage 3a: Secondary | ICD-10-CM | POA: Diagnosis not present

## 2023-09-19 DIAGNOSIS — Z794 Long term (current) use of insulin: Secondary | ICD-10-CM | POA: Diagnosis not present

## 2023-09-19 DIAGNOSIS — E1165 Type 2 diabetes mellitus with hyperglycemia: Secondary | ICD-10-CM | POA: Diagnosis not present

## 2023-10-04 DIAGNOSIS — R251 Tremor, unspecified: Secondary | ICD-10-CM | POA: Diagnosis not present

## 2023-10-04 DIAGNOSIS — G479 Sleep disorder, unspecified: Secondary | ICD-10-CM | POA: Diagnosis not present

## 2023-10-04 DIAGNOSIS — R413 Other amnesia: Secondary | ICD-10-CM | POA: Diagnosis not present

## 2023-10-04 NOTE — Progress Notes (Signed)
 Today the history is gathered from: 100% - patient  0% - patient's again  RECORDS SUMMARY: I have reviewed the note dated 03/30/2023 from Dr. Sadie who has indicated:  History of falls and Tremors  Given these abnormal neurologic findings, a referral to neurology has been recommended.  REFERRING PHYSICIAN: Pcp PRIMARY CARE PHYSICIAN:  Hande, Tamra Cal, MD   IMPRESSION/PLAN  Nancy Gaines is a 77 y.o. female presenting for evaluation of  HISTORY OF FALLS/ TREMORS/  - Stable. - Patient with past medical history of type 2 diabetes, hypertension, hyperlipidemia, breast cancer s/p right mastectomy, presenting with tremors of right hand. Stiffness in right shoulder has improved.  - Symptoms not consistent with Parkinsons at this time. Will continue to monitor tremor. - Memory score today was 23/30.   -Can consider brain MRI to further evaluate memory. -Most recent B12 and TSH were within normal limits. - Continue to stay physically and mentally active. - Continue to monitor blood sugars closely. - Can consider sleep study to evaluate sleep difficulty, daytime fatigue.  Call if you would like to have this done. -Could consider EMG lowers to evaluate numbness and tingling.   Follow up 6 to 12 months    Medications previously tried:   CHIEF COMPLAINT & HPI  Nancy Gaines is a 77 y.o. female presenting for evaluation of: Chief Complaint  Patient presents with  . HISTORY OF FALLS/ TREMORS    HISTORY OF FALLS/ TREMORS Patient states that symptoms have improved slightly since last visit.  Tremor is more infrequent.  States on occasion she will notice shaking in her right hand.  This only lasts for a few seconds and then resolves.  Can be with motion or at rest.  Patient denies stiffness or rigidity.  Occasional imbalance, no falls.  Does report some numbness and tingling in upper and lower extremities.  History of diabetes, most recent A1c was 7.6.  States that she has a hard time  with her diet.  Patient has been doing exercises for her right shoulder, stiffness has improved.  Has been exercising on the stationary bike.  Patient states she has had some difficulty with sleep.  She is napping frequently throughout the day and staying awake at nighttime.  She is unsure if she snores, states that she did snore in the past.  Patient does report some memory concerns.  States she has difficulty with short-term memory.  States that she will be talking and lose track of what she is saying.  Patient states that she has difficulty with short-term memory.  Testing is negative once and reminders.  No difficulty with long-term memory.  No difficulty with driving.  No difficulty with ADLs.  No difficulty with managing medications or finances.  No family history of dementia.   DATA SUMMARY: 12/11/2015 CT HEAD WO CONTRAST IMPRESSION:  No acute intracranial abnormality noted.    VISIT SUMMARIES:   MEDICATIONS Current Outpatient Medications  Medication Sig Dispense Refill  . alcohol swabs PadM Apply 1 each topically 3 (three) times daily 300 each 3  . aspirin 81 MG EC tablet Take 81 mg by mouth once daily.    . calcium carbonate-vitamin D3 600 mg-20 mcg (800 unit) Tab Take 1 tablet by mouth once daily    . EUTHYROX 50 mcg tablet TAKE 1 TABLET DAILY ON AN EMPTY STOMACH WITH A GLASS OF WATER AT LEAST 30-60 MINUTES BEFORE BREAKFAST. 90 tablet 3  . ferrous sulfate 325 (65 FE) MG tablet Take 325 mg by  mouth daily with breakfast.    . glimepiride (AMARYL) 4 MG tablet TAKE 1 TABLET TWICE DAILY WITH MEALS (Patient taking differently: Take 4 mg by mouth daily with breakfast) 180 tablet 3  . insulin NPH (NOVOLIN N NPH U-100 INSULIN) injection (concentration 100 units/mL) INJECT 35 UNITS UNDER THE SKIN IN THE MORNING AND 25 UNITS IN THE EVENING (60 UNITS TOTAL) 50 mL 3  . insulin syringe-needle U-100 1 mL 31 gauge x 15/64 Syrg 1 each 2 (two) times daily 200 each 3  . lancets Use 1 each 3 (three)  times daily Use as instructed. 300 each 3  . lidocaine -prilocaine  (EMLA ) cream Apply 1 Application topically once daily. Reported on 12/11/2015     . losartan-hydroCHLOROthiazide (HYZAAR) 100-12.5 mg tablet take 1 tablet every day 90 tablet 3  . metFORMIN (GLUCOPHAGE) 1000 MG tablet TAKE 1 TABLET TWICE DAILY WITH MEALS 180 tablet 3  . multivitamin tablet Take 1 tablet by mouth once daily.    . pen needle, diabetic 31 gauge x 3/16 needle Use as directed 100 each 12  . pravastatin (PRAVACHOL) 20 MG tablet take 1 tablet at bedtime 90 tablet 3  . semaglutide (OZEMPIC) 0.25 mg or 0.5 mg (2 mg/3 mL) pen injector Inject 0.75 mLs (0.5 mg total) subcutaneously once a week 2 mL 0  . TRUE METRIX AIR GLUCOSE METER kit 3 (three) times daily 1 each 0  . TRUE METRIX GLUCOSE TEST STRIP test strip 1 each (1 strip total) 3 (three) times daily Use as instructed. 300 each 3  . TRUE METRIX LEVEL 1 Soln Use 1 each as directed 1 each 3   No current facility-administered medications for this visit.    ALLERGIES Allergies  Allergen Reactions  . Lipitor [Atorvastatin] Muscle Pain     EXAM   Vitals:   10/04/23 0825  BP: (!) 154/87  Pulse: 92  Weight: 96.6 kg (213 lb)  Height: 158.8 cm (5' 2.52)  PainSc: 0-No pain   Body mass index is 38.31 kg/m.  GENERAL: Very pleasant female, NAD Normocephalic and atraumatic.  Baseline neurological exam below was obtained at prior office visit. Changes from today's visit appear in bold.    EYES: PERRLA EOM's intact  MUSCULOSKELETAL: Bulk - Normal Tone - Normal Pronator Drift - Absent bilaterally. Ambulation - Gait and station is steady, no shuffling gait, no multipoint turn, no tremor with ambulation, mild decreased arm swing in right arm; patient with right shoulder pain and hx of injury to right shoulder.  No difficulty rising from seated to standing.  Romberg -mildly positive  No tremor bilaterally noted at rest. No kinetic tremor with finger to nose.  No postural tremor bilaterally.   No cogwheeling or rigidity in B/l upper extremities.  No dysdiadochokinesia.  R/L 5/5    Shoulder abduction (deltoid/supraspinatus, axillary/suprascapular n, C5) 5/5    Elbow flexion (biceps brachii, musculoskeletal n, C5-6) 5/5    Elbow extension (triceps, radial n, C7) 5/5    Finger adduction (interossei, ulnar n, T1)  NEUROLOGICAL: MENTAL STATUS: Patient is oriented to person, place and time.   Short-term memory is intact Long-term memory is intact.   Attention span and concentration are intact.   Naming and repetition are intact. Comprehension is intact.   Expressive speech is intact.   Patient's fund of knowledge is within normal limits for educational level.  COORDINATION/CEREBELLAR: Finger to nose testing is intact.  PAST MEDICAL HISTORY Past Medical History:  Diagnosis Date  . Anemia   . Dermatophytosis  of nail   . Diverticulosis   . DJD (degenerative joint disease)   . Essential hypertension, benign   . History of breast cancer   . Malignant neoplasm of breast (female), unspecified site (CMS/HHS-HCC)   . Pure hypercholesterolemia   . Type II or unspecified type diabetes mellitus without mention of complication, not stated as uncontrolled (CMS/HHS-HCC)     PAST SURGICAL HISTORY Past Surgical History:  Procedure Laterality Date  . MASTECTOMY PARTIAL / LUMPECTOMY Right 1996  . HYSTERECTOMY  2008  . COLONOSCOPY  08/29/2015   Entire examined colon is normal/Repeat 85yrs/PYO  . ASPIRATION CYST BREAST Right   . DILATION AND CURETTAGE, DIAGNOSTIC / THERAPEUTIC    . MASTECTOMY SIMPLE Right     FAMILY HISTORY Family History  Problem Relation Name Age of Onset  . High blood pressure (Hypertension) Mother    . Alzheimer's disease Mother    . No Known Problems Father    . Breast cancer Sister    . Breast cancer Maternal Aunt    . Breast cancer Paternal Aunt      SOCIAL HISTORY  Social History   Tobacco Use  . Smoking  status: Never  . Smokeless tobacco: Never  Vaping Use  . Vaping status: Never Used  Substance Use Topics  . Alcohol use: No  . Drug use: No     REVIEW OF SYSTEMS:  13 system ROS was verbally reviewed with patient. Pertinent positives and negatives are mentioned above in the HPI and all other systems are negative.  DATA   Appointment on 08/02/2023  Component Date Value Ref Range Status  . WBC (White Blood Cell Count) 08/02/2023 9.0  4.1 - 10.2 10^3/uL Final  . RBC (Red Blood Cell Count) 08/02/2023 5.68 (H)  4.04 - 5.48 10^6/uL Final  . Hemoglobin 08/02/2023 12.1  12.0 - 15.0 gm/dL Final  . Hematocrit 89/98/7975 40.1  35.0 - 47.0 % Final  . MCV (Mean Corpuscular Volume) 08/02/2023 70.6 (L)  80.0 - 100.0 fl Final  . MCH (Mean Corpuscular Hemoglobin) 08/02/2023 21.3 (L)  27.0 - 31.2 pg Final  . MCHC (Mean Corpuscular Hemoglobin * 08/02/2023 30.2 (L)  32.0 - 36.0 gm/dL Final  . Platelet Count 08/02/2023 435  150 - 450 10^3/uL Final  . RDW-CV (Red Cell Distribution Widt* 08/02/2023 14.6  11.6 - 14.8 % Final  . MPV (Mean Platelet Volume) 08/02/2023 10.1  9.4 - 12.4 fl Final  . Neutrophils 08/02/2023 4.18  1.50 - 7.80 10^3/uL Final  . Lymphocytes 08/02/2023 4.13 (H)  1.00 - 3.60 10^3/uL Final  . Monocytes 08/02/2023 0.58  0.00 - 1.50 10^3/uL Final  . Eosinophils 08/02/2023 0.08  0.00 - 0.55 10^3/uL Final  . Basophils 08/02/2023 0.04  0.00 - 0.09 10^3/uL Final  . Neutrophil % 08/02/2023 46.4  32.0 - 70.0 % Final  . Lymphocyte % 08/02/2023 45.8  10.0 - 50.0 % Final  . Monocyte % 08/02/2023 6.4  4.0 - 13.0 % Final  . Eosinophil % 08/02/2023 0.9 (L)  1.0 - 5.0 % Final  . Basophil% 08/02/2023 0.4  0.0 - 2.0 % Final  . Immature Granulocyte % 08/02/2023 0.1  <=0.7 % Final  . Immature Granulocyte Count 08/02/2023 0.01  <=0.06 10^3/L Final  . Glucose 08/02/2023 63 (L)  70 - 110 mg/dL Final  . Sodium 89/98/7975 144  136 - 145 mmol/L Final  . Potassium 08/02/2023 4.3  3.6 - 5.1 mmol/L Final   . Chloride 08/02/2023 104  97 - 109 mmol/L  Final  . Carbon Dioxide (CO2) 08/02/2023 29.2  22.0 - 32.0 mmol/L Final  . Urea Nitrogen (BUN) 08/02/2023 16  7 - 25 mg/dL Final  . Creatinine 89/98/7975 1.1  0.6 - 1.1 mg/dL Final  . Glomerular Filtration Rate (eGFR) 08/02/2023 52 (L)  >60 mL/min/1.73sq m Final  . Calcium 08/02/2023 10.3  8.7 - 10.3 mg/dL Final  . AST  89/98/7975 26  8 - 39 U/L Final  . ALT  08/02/2023 33  5 - 38 U/L Final  . Alk Phos (alkaline Phosphatase) 08/02/2023 97  34 - 104 U/L Final  . Albumin 08/02/2023 4.3  3.5 - 4.8 g/dL Final  . Bilirubin, Total 08/02/2023 0.8  0.3 - 1.2 mg/dL Final  . Protein, Total 08/02/2023 6.6  6.1 - 7.9 g/dL Final  . A/G Ratio 89/98/7975 1.9  1.0 - 5.0 gm/dL Final  . Hemoglobin J8R 08/02/2023 7.6 (H)  4.2 - 5.6 % Final  . Average Blood Glucose (Calc) 08/02/2023 171  mg/dL Final  . Cholesterol, Total 08/02/2023 202 (H)  100 - 200 mg/dL Final  . Triglyceride 89/98/7975 226 (H)  35 - 199 mg/dL Final  . HDL (High Density Lipoprotein) Cho* 08/02/2023 38.8  35.0 - 85.0 mg/dL Final  . LDL Calculated 08/02/2023 881  0 - 130 mg/dL Final  . VLDL Cholesterol 08/02/2023 45  mg/dL Final  . Cholesterol/HDL Ratio 08/02/2023 5.2   Final  . Creatinine, Random Urine 08/02/2023 68.6  37.0 - 250.0 mg/dL Final  . Urine Albumin, Random 08/02/2023 8    mg/L Final  . Urine Albumin/Creatinine Ratio 08/02/2023 11.7  <30.0 ug/mg Final  . Thyroid  Stimulating Hormone (TSH) 08/02/2023 2.444  0.450-5.330 uIU/ml uIU/mL Final  . Color 08/02/2023 Colorless  Colorless, Straw, Light Yellow, Yellow, Dark Yellow Final  . Clarity 08/02/2023 Clear  Clear Final  . Specific Gravity 08/02/2023 1.006  1.005 - 1.030 Final  . pH, Urine 08/02/2023 5.5  5.0 - 8.0 Final  . Protein, Urinalysis 08/02/2023 Negative  Negative mg/dL Final  . Glucose, Urinalysis 08/02/2023 Negative  Negative mg/dL Final  . Ketones, Urinalysis 08/02/2023 Negative  Negative mg/dL Final  . Blood, Urinalysis  08/02/2023 Negative  Negative Final  . Nitrite, Urinalysis 08/02/2023 Negative  Negative Final  . Leukocyte Esterase, Urinalysis 08/02/2023 Negative  Negative Final  . Bilirubin, Urinalysis 08/02/2023 Negative  Negative Final  . Urobilinogen, Urinalysis 08/02/2023 0.2  0.2 - 1.0 mg/dL Final  . WBC, UA 89/98/7975 1  <=5 /hpf Final  . Red Blood Cells, Urinalysis 08/02/2023 1  <=3 /hpf Final  . Bacteria, Urinalysis 08/02/2023 0-5  0 - 5 /hpf Final  . Squamous Epithelial Cells, Urinaly* 08/02/2023 2  /hpf Final  . Ferritin 08/02/2023 215  11 - 307 ng/mL Final      No follow-ups on file.  Payor: [employer] MEDICARE ADVANTAGE PLANS / Plan: [employer] HMO GOLD PLUS / Product Type: HMO /   This note is partially written by Greig Pouch, scribe, in the presence of and acting as the scribe of Lauraine Rocks, PA-C.    I agree that the scribe documentation is complete and accurate.  This note was generated in part with voice recognition software and I apologize for any typographical errors that were not detected and corrected.     Attestation Statement:   I personally performed the service, non-incident to. (WP)   SARAH ALMARIE ROCKS, PA

## 2023-10-19 DIAGNOSIS — Z794 Long term (current) use of insulin: Secondary | ICD-10-CM | POA: Diagnosis not present

## 2023-10-19 DIAGNOSIS — E1165 Type 2 diabetes mellitus with hyperglycemia: Secondary | ICD-10-CM | POA: Diagnosis not present

## 2023-10-19 DIAGNOSIS — N1831 Chronic kidney disease, stage 3a: Secondary | ICD-10-CM | POA: Diagnosis not present

## 2023-11-08 DIAGNOSIS — E782 Mixed hyperlipidemia: Secondary | ICD-10-CM | POA: Diagnosis not present

## 2023-11-08 DIAGNOSIS — Z794 Long term (current) use of insulin: Secondary | ICD-10-CM | POA: Diagnosis not present

## 2023-11-08 DIAGNOSIS — I10 Essential (primary) hypertension: Secondary | ICD-10-CM | POA: Diagnosis not present

## 2023-11-08 DIAGNOSIS — E1165 Type 2 diabetes mellitus with hyperglycemia: Secondary | ICD-10-CM | POA: Diagnosis not present

## 2023-12-05 DIAGNOSIS — E1165 Type 2 diabetes mellitus with hyperglycemia: Secondary | ICD-10-CM | POA: Diagnosis not present

## 2023-12-05 DIAGNOSIS — I10 Essential (primary) hypertension: Secondary | ICD-10-CM | POA: Diagnosis not present

## 2023-12-12 DIAGNOSIS — E669 Obesity, unspecified: Secondary | ICD-10-CM | POA: Diagnosis not present

## 2023-12-12 DIAGNOSIS — E119 Type 2 diabetes mellitus without complications: Secondary | ICD-10-CM | POA: Diagnosis not present

## 2023-12-12 DIAGNOSIS — Z1231 Encounter for screening mammogram for malignant neoplasm of breast: Secondary | ICD-10-CM | POA: Diagnosis not present

## 2023-12-12 DIAGNOSIS — I10 Essential (primary) hypertension: Secondary | ICD-10-CM | POA: Diagnosis not present

## 2023-12-12 DIAGNOSIS — Z794 Long term (current) use of insulin: Secondary | ICD-10-CM | POA: Diagnosis not present

## 2023-12-12 DIAGNOSIS — Z853 Personal history of malignant neoplasm of breast: Secondary | ICD-10-CM | POA: Diagnosis not present

## 2023-12-12 DIAGNOSIS — Z0000 Encounter for general adult medical examination without abnormal findings: Secondary | ICD-10-CM | POA: Diagnosis not present

## 2023-12-12 DIAGNOSIS — Z1331 Encounter for screening for depression: Secondary | ICD-10-CM | POA: Diagnosis not present

## 2023-12-12 DIAGNOSIS — Z6838 Body mass index (BMI) 38.0-38.9, adult: Secondary | ICD-10-CM | POA: Diagnosis not present

## 2023-12-13 NOTE — Progress Notes (Signed)
 Received  2 boxes of needles Ozempic- 4 boxes Tresiba- 4 boxes   LM informing patient

## 2023-12-19 DIAGNOSIS — E782 Mixed hyperlipidemia: Secondary | ICD-10-CM | POA: Diagnosis not present

## 2023-12-19 DIAGNOSIS — E1165 Type 2 diabetes mellitus with hyperglycemia: Secondary | ICD-10-CM | POA: Diagnosis not present

## 2023-12-28 ENCOUNTER — Ambulatory Visit
Admission: RE | Admit: 2023-12-28 | Discharge: 2023-12-28 | Disposition: A | Discharge status: Home / Self Care | Payer: Medicare HMO | Source: Ambulatory Visit | Attending: Internal Medicine | Admitting: Internal Medicine

## 2023-12-28 DIAGNOSIS — Z853 Personal history of malignant neoplasm of breast: Secondary | ICD-10-CM | POA: Diagnosis not present

## 2023-12-28 DIAGNOSIS — Z1231 Encounter for screening mammogram for malignant neoplasm of breast: Secondary | ICD-10-CM | POA: Diagnosis not present

## 2023-12-28 DIAGNOSIS — Z9011 Acquired absence of right breast and nipple: Secondary | ICD-10-CM | POA: Insufficient documentation

## 2023-12-28 DIAGNOSIS — Z170 Estrogen receptor positive status [ER+]: Secondary | ICD-10-CM | POA: Diagnosis not present

## 2024-01-02 ENCOUNTER — Inpatient Hospital Stay (HOSPITAL_BASED_OUTPATIENT_CLINIC_OR_DEPARTMENT_OTHER): Payer: Medicare HMO | Admitting: Internal Medicine

## 2024-01-02 ENCOUNTER — Inpatient Hospital Stay: Payer: Medicare HMO | Attending: Internal Medicine

## 2024-01-02 ENCOUNTER — Encounter: Payer: Self-pay | Admitting: Internal Medicine

## 2024-01-02 VITALS — BP 117/56 | HR 83 | Temp 97.1°F | Ht 62.0 in | Wt 208.8 lb

## 2024-01-02 DIAGNOSIS — C50811 Malignant neoplasm of overlapping sites of right female breast: Secondary | ICD-10-CM

## 2024-01-02 DIAGNOSIS — Z9011 Acquired absence of right breast and nipple: Secondary | ICD-10-CM | POA: Insufficient documentation

## 2024-01-02 DIAGNOSIS — E1122 Type 2 diabetes mellitus with diabetic chronic kidney disease: Secondary | ICD-10-CM | POA: Insufficient documentation

## 2024-01-02 DIAGNOSIS — D509 Iron deficiency anemia, unspecified: Secondary | ICD-10-CM | POA: Diagnosis not present

## 2024-01-02 DIAGNOSIS — Z170 Estrogen receptor positive status [ER+]: Secondary | ICD-10-CM | POA: Diagnosis not present

## 2024-01-02 DIAGNOSIS — N183 Chronic kidney disease, stage 3 unspecified: Secondary | ICD-10-CM | POA: Insufficient documentation

## 2024-01-02 DIAGNOSIS — I129 Hypertensive chronic kidney disease with stage 1 through stage 4 chronic kidney disease, or unspecified chronic kidney disease: Secondary | ICD-10-CM | POA: Diagnosis not present

## 2024-01-02 DIAGNOSIS — Z7982 Long term (current) use of aspirin: Secondary | ICD-10-CM | POA: Diagnosis not present

## 2024-01-02 DIAGNOSIS — Z79899 Other long term (current) drug therapy: Secondary | ICD-10-CM | POA: Insufficient documentation

## 2024-01-02 DIAGNOSIS — Z79811 Long term (current) use of aromatase inhibitors: Secondary | ICD-10-CM | POA: Insufficient documentation

## 2024-01-02 DIAGNOSIS — Z7984 Long term (current) use of oral hypoglycemic drugs: Secondary | ICD-10-CM | POA: Insufficient documentation

## 2024-01-02 DIAGNOSIS — Z90710 Acquired absence of both cervix and uterus: Secondary | ICD-10-CM | POA: Insufficient documentation

## 2024-01-02 DIAGNOSIS — Z794 Long term (current) use of insulin: Secondary | ICD-10-CM | POA: Insufficient documentation

## 2024-01-02 DIAGNOSIS — Z923 Personal history of irradiation: Secondary | ICD-10-CM | POA: Insufficient documentation

## 2024-01-02 DIAGNOSIS — Z7985 Long-term (current) use of injectable non-insulin antidiabetic drugs: Secondary | ICD-10-CM | POA: Diagnosis not present

## 2024-01-02 LAB — CBC WITH DIFFERENTIAL (CANCER CENTER ONLY)
Abs Immature Granulocytes: 0.02 10*3/uL (ref 0.00–0.07)
Basophils Absolute: 0.1 10*3/uL (ref 0.0–0.1)
Basophils Relative: 1 %
Eosinophils Absolute: 0.1 10*3/uL (ref 0.0–0.5)
Eosinophils Relative: 1 %
HCT: 37 % (ref 36.0–46.0)
Hemoglobin: 11.3 g/dL — ABNORMAL LOW (ref 12.0–15.0)
Immature Granulocytes: 0 %
Lymphocytes Relative: 40 %
Lymphs Abs: 3.2 10*3/uL (ref 0.7–4.0)
MCH: 21.2 pg — ABNORMAL LOW (ref 26.0–34.0)
MCHC: 30.5 g/dL (ref 30.0–36.0)
MCV: 69.3 fL — ABNORMAL LOW (ref 80.0–100.0)
Monocytes Absolute: 0.4 10*3/uL (ref 0.1–1.0)
Monocytes Relative: 5 %
Neutro Abs: 4.2 10*3/uL (ref 1.7–7.7)
Neutrophils Relative %: 53 %
Platelet Count: 379 10*3/uL (ref 150–400)
RBC: 5.34 MIL/uL — ABNORMAL HIGH (ref 3.87–5.11)
RDW: 14.7 % (ref 11.5–15.5)
WBC Count: 7.9 10*3/uL (ref 4.0–10.5)
nRBC: 0 % (ref 0.0–0.2)

## 2024-01-02 LAB — CMP (CANCER CENTER ONLY)
ALT: 26 U/L (ref 0–44)
AST: 21 U/L (ref 15–41)
Albumin: 3.8 g/dL (ref 3.5–5.0)
Alkaline Phosphatase: 74 U/L (ref 38–126)
Anion gap: 10 (ref 5–15)
BUN: 24 mg/dL — ABNORMAL HIGH (ref 8–23)
CO2: 27 mmol/L (ref 22–32)
Calcium: 9.6 mg/dL (ref 8.9–10.3)
Chloride: 101 mmol/L (ref 98–111)
Creatinine: 1.18 mg/dL — ABNORMAL HIGH (ref 0.44–1.00)
GFR, Estimated: 48 mL/min — ABNORMAL LOW (ref >60)
Glucose, Bld: 189 mg/dL — ABNORMAL HIGH (ref 70–99)
Potassium: 4.2 mmol/L (ref 3.5–5.1)
Sodium: 138 mmol/L (ref 135–145)
Total Bilirubin: 1.1 mg/dL (ref 0.0–1.2)
Total Protein: 6.5 g/dL (ref 6.5–8.1)

## 2024-01-02 LAB — VITAMIN D 25 HYDROXY (VIT D DEFICIENCY, FRACTURES): Vit D, 25-Hydroxy: 66.96 ng/mL (ref 30–100)

## 2024-01-02 NOTE — Progress Notes (Signed)
  [facility] OFFICE PROGRESS NOTE  Patient Care Team: Barbette Reichmann, MD as PCP - General (Internal Medicine) Carolan Shiver, MD as Consulting Physician (General Surgery) Earna Coder, MD as Consulting Physician (Hematology and Oncology)   Cancer Staging  No matching staging information was found for the patient.    Oncology History Overview Note  # 1996- Right breast. 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, [name], [name], [name]; [name], 2014) 7. Patient was started on Cytoxan and Taxotere January 11, 2013; Patient is on letrozole from April of 2014  # NOV 2016- BMD- Normal.  3. BRCA negative (verbal information from [facility])  # mild anemia- ? Thalassemia- Hb ~11; [name]      ;  GOALS: cure  CURRENT/MOST RECENT THERAPY : a letrozole       Carcinoma of overlapping sites of right breast in female, estrogen receptor positive (HCC)   INTERVAL HISTORY: Ambulating independently.  Alone.  Nancy Gaines 78 y.o.  female pleasant patient above history of Breast cancer ER/PR positive HER-2/neu negative stage I-status post extended AI- Letrozole [finished  April 2024] currently on surveillance is here for follow-up/ Left mammogram.   Patient is currently on Ozempic. States her Blood sugars better.  However still struggling to lose weight.  Denies any new lumps or bumps.  Patient denies any unusual lumps or bumps.  No nausea no vomiting.  No worsening back pain.    Review of Systems  Constitutional:  Negative for chills, diaphoresis, fever, [name], hemoptysis, sputum production, [name], palpitations, orthopnea and leg swelling.  Gastrointestinal:  Negative for abdominal pain, blood in stool, constipation, diarrhea, heartburn, melena, nausea and vomiting.  Genitourinary:  Negative for dysuria, frequency and urgency.  Musculoskeletal:  Positive for joint pain. Negative for back pain.  Skin: Negative.  Negative for itching and rash.  Neurological:  Negative for dizziness, tingling, focal weakness, weakness and headaches.  Endo/Heme/Allergies:  Does not bruise/bleed easily.  Psychiatric/Behavioral:  Negative for depression. The patient is not nervous/anxious and does not have insomnia.      PAST MEDICAL HISTORY :  Past Medical History:  Diagnosis Date   Anemia    Breast cancer (HCC) 2014   right breast ca- chemo and mastectomy   Cancer (HCC) 1996   right breast cancer- radiation and lumpectomy   Degenerative joint disease    Dermatophytosis of nail    Diabetes mellitus without complication (HCC)    Diverticulosis    Hypercholesterolemia  Hypertension    Personal history of chemotherapy 2014   BREAST CA   Personal history of radiation therapy 1996   BREAST CA    PAST SURGICAL HISTORY :   Past Surgical History:  Procedure Laterality Date   ABDOMINAL HYSTERECTOMY     COLONOSCOPY WITH PROPOFOL N/A 08/29/2015    Procedure: COLONOSCOPY WITH PROPOFOL;  Surgeon: Wallace Cullens, MD;  Location: [facility] ENDOSCOPY;  Service: Gastroenterology;  [name];   DILATION AND CURETTAGE, DIAGNOSTIC / THERAPEUTIC     MASTECTOMY Right 2014   BREAST CA    FAMILY HISTORY :   Family History  Problem Relation Age of Onset   Diabetes Sister    Breast cancer Sister 3   Breast cancer Maternal Aunt    Breast cancer Paternal Aunt     SOCIAL HISTORY:   Social History   Tobacco Use   Smoking status: Never   Smokeless tobacco: Never  Vaping Use   Vaping status: Never Used  Substance Use Topics   Alcohol use: No   Drug use: No    ALLERGIES:  is allergic to lipitor [atorvastatin].  MEDICATIONS:  Current Outpatient Medications  Medication Sig Dispense Refill   aspirin 81 MG tablet Take 81 mg by mouth daily.     Calcium Carb-Cholecalciferol 600-20 MG-MCG TABS Take 1 tablet by mouth daily.     ferrous sulfate 325 (65 FE) MG tablet Take 325 mg by mouth daily with breakfast.      glimepiride (AMARYL) 4 MG tablet Take 4 mg by mouth daily with breakfast.     glucose blood test strip      levothyroxine (SYNTHROID) 50 MCG tablet Take 50 mcg by mouth daily.     losartan-hydrochlorothiazide (HYZAAR) 100-12.5 MG tablet Take 1 tablet by mouth daily.     metFORMIN (GLUCOPHAGE) 1000 MG tablet Take 1,000 mg by mouth 2 (two) times daily with a meal.      Multiple Vitamin (MULTI-VITAMINS) TABS Take by mouth.     NOVOLIN N 100 UNIT/ML injection Inject 35 Units into the skin daily before breakfast. And 20 units in the evening with dinner     OZEMPIC, 0.25 OR 0.5 MG/DOSE, 2 MG/3ML SOPN Inject into the skin.     pravastatin (PRAVACHOL) 20 MG tablet Take 1 tablet by mouth at bedtime.     No current facility-administered medications for this visit.   Facility-Administered Medications Ordered in Other Visits  Medication Dose Route Frequency Provider Last Rate Last Admin   heparin lock flush 100 unit/mL  500 Units Intravenous Once  Louretta Shorten R, MD       heparin lock flush 100 unit/mL  500 Units Intravenous Once Louretta Shorten R, [name] mL Intravenous Once Louretta Shorten R, [name] mL Intravenous Once Louretta Shorten R, [name] mL Intravenous PRN Earna Coder, MD        PHYSICAL EXAMINATION: ECOG PERFORMANCE STATUS: 0 - Asymptomatic  BP (!) 117/56 (BP Location: Left Arm, Patient Position: Sitting, Cuff Size: Large)   Pulse 83   Temp (!) 97.1 F (36.2 C) (Tympanic)   Ht 5\' 2"  (1.575 m)   Wt 208 lb 12.8 oz (94.7 kg)   SpO2 100%  BMI 38.19 kg/m   Filed Weights   01/02/24 1024  Weight: 208 lb 12.8 oz (94.7 kg)      Physical Exam HENT:     Head: Normocephalic and atraumatic.     Mouth/Throat:     Pharynx: No oropharyngeal exudate.  Eyes:     Pupils: Pupils are equal, round, and reactive to light.  Cardiovascular:     Rate and Rhythm: Normal rate and regular rhythm.  Pulmonary:     Effort: No respiratory distress.     Breath sounds: No wheezing.  Abdominal:     General: Bowel sounds are normal. There is no distension.     Palpations: Abdomen is soft. There is no mass.     Tenderness: There is no abdominal tenderness. There is no guarding or rebound.  Musculoskeletal:        General: No tenderness. Normal range of motion.     Cervical back: Normal range of motion and neck supple.  Skin:    General: Skin is warm.  Neurological:     Mental Status: She is alert and oriented to person, place, and time.  Psychiatric:        Mood and Affect: Affect normal.     .  LABORATORY DATA:  I have reviewed the data as listed    Component Value Date/Time   NA 138 01/02/2024 1017   NA 141 05/06/2014 1039   K 4.2 01/02/2024 1017   K 4.4 05/06/2014 1039   CL 101 01/02/2024 1017   CL 104 05/06/2014 1039   CO2 27  01/02/2024 1017   CO2 30 05/06/2014 1039   GLUCOSE 189 (H) 01/02/2024 1017   GLUCOSE 184 (H) 05/06/2014 1039   BUN 24 (H) 01/02/2024 1017   BUN 21 (H) 05/06/2014 1039   CREATININE 1.18 (H) 01/02/2024 1017   CREATININE 1.04 05/06/2014 1039   CALCIUM 9.6 01/02/2024 1017   CALCIUM 9.7 05/06/2014 1039   PROT 6.5 01/02/2024 1017   PROT 7.3 05/06/2014 1039   ALBUMIN 3.8 01/02/2024 1017   ALBUMIN 3.7 05/06/2014 1039   AST 21 01/02/2024 1017   ALT 26 01/02/2024 1017   ALT 63 05/06/2014 1039   ALKPHOS 74 01/02/2024 1017   ALKPHOS 102 05/06/2014 1039   BILITOT 1.1 01/02/2024 1017   GFRNONAA 48 (L) 01/02/2024 1017   GFRNONAA 56 (L) 05/06/2014 1039   GFRAA >60 06/05/2020 1012   GFRAA >60 05/06/2014 1039    No results found for: "SPEP", "UPEP"  Lab Results  Component Value Date   WBC 7.9 01/02/2024   NEUTROABS 4.2 01/02/2024   HGB 11.3 (L) 01/02/2024   HCT 37.0 01/02/2024   MCV 69.3 (L) 01/02/2024   PLT 379 01/02/2024      Chemistry      Component Value Date/Time   NA 138 01/02/2024 1017   NA 141 05/06/2014 1039   K 4.2 01/02/2024 1017   K 4.4 05/06/2014 1039   CL 101 01/02/2024 1017   CL 104 05/06/2014 1039   CO2 27 01/02/2024 1017   CO2 30 05/06/2014 1039   BUN 24 (H) 01/02/2024 1017   BUN 21 (H) 05/06/2014 1039   CREATININE 1.18 (H) 01/02/2024 1017   CREATININE 1.04 05/06/2014 1039      Component Value Date/Time   CALCIUM 9.6 01/02/2024 1017   CALCIUM 9.7 05/06/2014 1039   ALKPHOS 74 01/02/2024 1017   ALKPHOS 102 05/06/2014 1039   AST 21 01/02/2024 1017   ALT 26 01/02/2024 1017  ALT 63 05/06/2014 1039   BILITOT 1.1 01/02/2024 1017      RADIOGRAPHIC STUDIES: I have personally reviewed the radiological images as listed and agreed with the findings in the report. No results found.   ASSESSMENT & PLAN:  Carcinoma of overlapping sites of right breast in female, estrogen receptor positive (HCC) # RIGHT BREAST IPISILATERAL RECURRENCE- STAGE I Breast cancer  ER/PR positive HER-2/neu negative- currently s/p Ext.AI- letrozole [finished 2024-].  stable    # Clinically no evidence of recurrence.  UNI LAT LEFT mammo-FEB 2025 -wnl. stable   # Microcytic anemia- hemoglobin 10-11-suspect thalassemia.  Asymptomatic; stable   # BMD- T-score of -0.1.  [FEB 2024]- Continue calcium plus vitamin D-stable   # DM- BG 189 fasting; HbA1C- 10.1 [April 2023] - on ozempic- improved/ stable- Encouraged the patient to keep losing weight-  stable   # CKD- Stage III- 54 discussed related to Diabetes.- stable   Counseled the patient regarding the importance of weight loss/better control of diabetes.   stable   # DISPOSITION: # follow up in 6  months-  -MD; labs-cbc/cmp; vit D 25-OH-;Dr.B    Orders Placed This Encounter  Procedures   CBC with Differential (Cancer Center Only)    Standing Status:   Future    Expected Date:   07/04/2024    Expiration Date:   01/01/2025   CMP (Cancer Center only)    Standing Status:   Future    Expected Date:   07/04/2024    Expiration Date:   01/01/2025   VITAMIN D 25 Hydroxy (Vit-D Deficiency, Fractures)    Standing Status:   Future    Expected Date:   07/04/2024    Expiration Date:   01/01/2025   All questions were answered. The patient knows to call the clinic with any problems, questions or concerns.     Earna Coder, MD 01/02/2024 11:36 AM

## 2024-01-02 NOTE — Assessment & Plan Note (Signed)
#   RIGHT BREAST IPISILATERAL RECURRENCE- STAGE I Breast cancer ER/PR positive HER-2/neu negative- currently s/p Ext.AI- letrozole [finished 2024-].  stable    # Clinically no evidence of recurrence.  UNI LAT LEFT mammo-FEB 2025 -wnl. stable   # Microcytic anemia- hemoglobin 10-11-suspect thalassemia.  Asymptomatic; stable   # BMD- T-score of -0.1.  [FEB 2024]- Continue calcium plus vitamin D-stable   # DM- BG 189 fasting; HbA1C- 10.1 [April 2023] - on ozempic- improved/ stable- Encouraged the patient to keep losing weight-  stable   # CKD- Stage III- 54 discussed related to Diabetes.- stable   Counseled the patient regarding the importance of weight loss/better control of diabetes.   stable   # DISPOSITION: # follow up in 6  months-  -MD; labs-cbc/cmp; vit D 25-OH-;Dr.B

## 2024-01-02 NOTE — Progress Notes (Signed)
 Mammogram 12/28/23.

## 2024-01-09 DIAGNOSIS — Z794 Long term (current) use of insulin: Secondary | ICD-10-CM | POA: Diagnosis not present

## 2024-01-09 DIAGNOSIS — E1165 Type 2 diabetes mellitus with hyperglycemia: Secondary | ICD-10-CM | POA: Diagnosis not present

## 2024-01-09 DIAGNOSIS — E782 Mixed hyperlipidemia: Secondary | ICD-10-CM | POA: Diagnosis not present

## 2024-04-10 DIAGNOSIS — E1165 Type 2 diabetes mellitus with hyperglycemia: Secondary | ICD-10-CM | POA: Diagnosis not present

## 2024-04-10 DIAGNOSIS — G3184 Mild cognitive impairment, so stated: Secondary | ICD-10-CM | POA: Diagnosis not present

## 2024-04-10 DIAGNOSIS — E039 Hypothyroidism, unspecified: Secondary | ICD-10-CM | POA: Diagnosis not present

## 2024-04-10 DIAGNOSIS — R251 Tremor, unspecified: Secondary | ICD-10-CM | POA: Diagnosis not present

## 2024-04-10 DIAGNOSIS — I10 Essential (primary) hypertension: Secondary | ICD-10-CM | POA: Diagnosis not present

## 2024-04-10 DIAGNOSIS — Z794 Long term (current) use of insulin: Secondary | ICD-10-CM | POA: Diagnosis not present

## 2024-04-10 DIAGNOSIS — G479 Sleep disorder, unspecified: Secondary | ICD-10-CM | POA: Diagnosis not present

## 2024-04-17 DIAGNOSIS — E119 Type 2 diabetes mellitus without complications: Secondary | ICD-10-CM | POA: Diagnosis not present

## 2024-04-17 DIAGNOSIS — E669 Obesity, unspecified: Secondary | ICD-10-CM | POA: Diagnosis not present

## 2024-04-17 DIAGNOSIS — Z1331 Encounter for screening for depression: Secondary | ICD-10-CM | POA: Diagnosis not present

## 2024-04-17 DIAGNOSIS — Z6836 Body mass index (BMI) 36.0-36.9, adult: Secondary | ICD-10-CM | POA: Diagnosis not present

## 2024-04-17 DIAGNOSIS — Z0000 Encounter for general adult medical examination without abnormal findings: Secondary | ICD-10-CM | POA: Diagnosis not present

## 2024-04-17 DIAGNOSIS — D649 Anemia, unspecified: Secondary | ICD-10-CM | POA: Diagnosis not present

## 2024-04-17 DIAGNOSIS — E039 Hypothyroidism, unspecified: Secondary | ICD-10-CM | POA: Diagnosis not present

## 2024-04-17 DIAGNOSIS — M1712 Unilateral primary osteoarthritis, left knee: Secondary | ICD-10-CM | POA: Diagnosis not present

## 2024-04-17 DIAGNOSIS — I10 Essential (primary) hypertension: Secondary | ICD-10-CM | POA: Diagnosis not present

## 2024-05-29 DIAGNOSIS — R251 Tremor, unspecified: Secondary | ICD-10-CM | POA: Diagnosis not present

## 2024-05-29 DIAGNOSIS — G479 Sleep disorder, unspecified: Secondary | ICD-10-CM | POA: Diagnosis not present

## 2024-05-29 DIAGNOSIS — G3184 Mild cognitive impairment, so stated: Secondary | ICD-10-CM | POA: Diagnosis not present

## 2024-07-04 ENCOUNTER — Inpatient Hospital Stay: Admitting: Internal Medicine

## 2024-07-04 ENCOUNTER — Inpatient Hospital Stay

## 2024-07-06 ENCOUNTER — Telehealth: Payer: Self-pay | Admitting: Internal Medicine

## 2024-07-06 NOTE — Telephone Encounter (Signed)
 Pt had left vm to confirm her appts.  I called pt back and spoke with her and confirmed the date/time of her appts on 10/7.

## 2024-08-07 ENCOUNTER — Inpatient Hospital Stay

## 2024-08-07 ENCOUNTER — Inpatient Hospital Stay: Admitting: Internal Medicine

## 2024-08-07 DIAGNOSIS — H35371 Puckering of macula, right eye: Secondary | ICD-10-CM | POA: Diagnosis not present

## 2024-08-07 DIAGNOSIS — H52223 Regular astigmatism, bilateral: Secondary | ICD-10-CM | POA: Diagnosis not present

## 2024-08-07 DIAGNOSIS — H2513 Age-related nuclear cataract, bilateral: Secondary | ICD-10-CM | POA: Diagnosis not present

## 2024-08-07 DIAGNOSIS — E113292 Type 2 diabetes mellitus with mild nonproliferative diabetic retinopathy without macular edema, left eye: Secondary | ICD-10-CM | POA: Diagnosis not present

## 2024-08-07 DIAGNOSIS — H47393 Other disorders of optic disc, bilateral: Secondary | ICD-10-CM | POA: Diagnosis not present

## 2024-08-07 DIAGNOSIS — H5203 Hypermetropia, bilateral: Secondary | ICD-10-CM | POA: Diagnosis not present

## 2024-08-08 DIAGNOSIS — M1712 Unilateral primary osteoarthritis, left knee: Secondary | ICD-10-CM | POA: Diagnosis not present

## 2024-08-08 DIAGNOSIS — E039 Hypothyroidism, unspecified: Secondary | ICD-10-CM | POA: Diagnosis not present

## 2024-08-08 DIAGNOSIS — D649 Anemia, unspecified: Secondary | ICD-10-CM | POA: Diagnosis not present

## 2024-08-08 DIAGNOSIS — I10 Essential (primary) hypertension: Secondary | ICD-10-CM | POA: Diagnosis not present

## 2024-08-08 DIAGNOSIS — Z1331 Encounter for screening for depression: Secondary | ICD-10-CM | POA: Diagnosis not present

## 2024-08-08 DIAGNOSIS — Z6836 Body mass index (BMI) 36.0-36.9, adult: Secondary | ICD-10-CM | POA: Diagnosis not present

## 2024-08-08 DIAGNOSIS — E1165 Type 2 diabetes mellitus with hyperglycemia: Secondary | ICD-10-CM | POA: Diagnosis not present

## 2024-08-22 DIAGNOSIS — H6121 Impacted cerumen, right ear: Secondary | ICD-10-CM | POA: Diagnosis not present

## 2024-08-22 DIAGNOSIS — Z23 Encounter for immunization: Secondary | ICD-10-CM | POA: Diagnosis not present

## 2024-08-22 DIAGNOSIS — E1165 Type 2 diabetes mellitus with hyperglycemia: Secondary | ICD-10-CM | POA: Diagnosis not present

## 2024-08-22 DIAGNOSIS — E785 Hyperlipidemia, unspecified: Secondary | ICD-10-CM | POA: Diagnosis not present

## 2024-08-22 DIAGNOSIS — E782 Mixed hyperlipidemia: Secondary | ICD-10-CM | POA: Diagnosis not present

## 2024-08-22 DIAGNOSIS — I10 Essential (primary) hypertension: Secondary | ICD-10-CM | POA: Diagnosis not present

## 2024-08-22 DIAGNOSIS — L600 Ingrowing nail: Secondary | ICD-10-CM | POA: Diagnosis not present

## 2024-08-22 DIAGNOSIS — E119 Type 2 diabetes mellitus without complications: Secondary | ICD-10-CM | POA: Diagnosis not present

## 2024-08-22 DIAGNOSIS — Z853 Personal history of malignant neoplasm of breast: Secondary | ICD-10-CM | POA: Diagnosis not present

## 2024-08-22 DIAGNOSIS — Z794 Long term (current) use of insulin: Secondary | ICD-10-CM | POA: Diagnosis not present

## 2024-08-22 DIAGNOSIS — Z6835 Body mass index (BMI) 35.0-35.9, adult: Secondary | ICD-10-CM | POA: Diagnosis not present

## 2024-08-22 DIAGNOSIS — E039 Hypothyroidism, unspecified: Secondary | ICD-10-CM | POA: Diagnosis not present

## 2024-08-27 DIAGNOSIS — M2041 Other hammer toe(s) (acquired), right foot: Secondary | ICD-10-CM | POA: Diagnosis not present

## 2024-08-27 DIAGNOSIS — B351 Tinea unguium: Secondary | ICD-10-CM | POA: Diagnosis not present

## 2024-08-27 DIAGNOSIS — R1084 Generalized abdominal pain: Secondary | ICD-10-CM | POA: Diagnosis not present

## 2024-08-27 DIAGNOSIS — E119 Type 2 diabetes mellitus without complications: Secondary | ICD-10-CM | POA: Diagnosis not present

## 2024-08-27 DIAGNOSIS — R112 Nausea with vomiting, unspecified: Secondary | ICD-10-CM | POA: Diagnosis not present

## 2024-08-27 DIAGNOSIS — L851 Acquired keratosis [keratoderma] palmaris et plantaris: Secondary | ICD-10-CM | POA: Diagnosis not present

## 2024-08-29 DIAGNOSIS — H6123 Impacted cerumen, bilateral: Secondary | ICD-10-CM | POA: Diagnosis not present

## 2024-09-07 ENCOUNTER — Other Ambulatory Visit: Payer: Self-pay

## 2024-09-12 ENCOUNTER — Other Ambulatory Visit: Payer: Self-pay

## 2024-09-18 ENCOUNTER — Other Ambulatory Visit: Payer: Self-pay

## 2024-09-19 NOTE — Assessment & Plan Note (Signed)
#   RIGHT BREAST IPISILATERAL RECURRENCE- STAGE I Breast cancer ER/PR positive HER-2/neu negative- currently s/p Ext.AI- letrozole [finished 2024-].  stable    # Clinically no evidence of recurrence.  UNI LAT LEFT mammo-FEB 2025 -wnl. stable   # Microcytic anemia- hemoglobin 10-11-suspect thalassemia.  Asymptomatic; stable   # BMD- T-score of -0.1.  [FEB 2024]- Continue calcium plus vitamin D-stable   # DM- BG 189 fasting; HbA1C- 10.1 [April 2023] - on ozempic- improved/ stable- Encouraged the patient to keep losing weight-  stable   # CKD- Stage III- 54 discussed related to Diabetes.- stable   Counseled the patient regarding the importance of weight loss/better control of diabetes.   stable   # DISPOSITION: # follow up in 6  months-  -MD; labs-cbc/cmp; vit D 25-OH-;Dr.B

## 2024-09-20 ENCOUNTER — Inpatient Hospital Stay: Attending: Internal Medicine

## 2024-09-20 ENCOUNTER — Inpatient Hospital Stay: Admitting: Internal Medicine

## 2024-09-20 ENCOUNTER — Encounter: Payer: Self-pay | Admitting: Internal Medicine

## 2024-09-20 VITALS — BP 157/74 | HR 75 | Temp 96.7°F | Resp 16 | Ht 62.0 in | Wt 189.3 lb

## 2024-09-20 DIAGNOSIS — Z79811 Long term (current) use of aromatase inhibitors: Secondary | ICD-10-CM | POA: Diagnosis not present

## 2024-09-20 DIAGNOSIS — D509 Iron deficiency anemia, unspecified: Secondary | ICD-10-CM | POA: Diagnosis not present

## 2024-09-20 DIAGNOSIS — C50811 Malignant neoplasm of overlapping sites of right female breast: Secondary | ICD-10-CM | POA: Insufficient documentation

## 2024-09-20 DIAGNOSIS — Z1231 Encounter for screening mammogram for malignant neoplasm of breast: Secondary | ICD-10-CM | POA: Diagnosis not present

## 2024-09-20 DIAGNOSIS — N183 Chronic kidney disease, stage 3 unspecified: Secondary | ICD-10-CM | POA: Diagnosis not present

## 2024-09-20 DIAGNOSIS — E1122 Type 2 diabetes mellitus with diabetic chronic kidney disease: Secondary | ICD-10-CM | POA: Diagnosis not present

## 2024-09-20 DIAGNOSIS — Z170 Estrogen receptor positive status [ER+]: Secondary | ICD-10-CM

## 2024-09-20 DIAGNOSIS — Z7984 Long term (current) use of oral hypoglycemic drugs: Secondary | ICD-10-CM | POA: Insufficient documentation

## 2024-09-20 DIAGNOSIS — R634 Abnormal weight loss: Secondary | ICD-10-CM | POA: Insufficient documentation

## 2024-09-20 DIAGNOSIS — Z9011 Acquired absence of right breast and nipple: Secondary | ICD-10-CM | POA: Insufficient documentation

## 2024-09-20 DIAGNOSIS — Z90710 Acquired absence of both cervix and uterus: Secondary | ICD-10-CM | POA: Insufficient documentation

## 2024-09-20 DIAGNOSIS — I129 Hypertensive chronic kidney disease with stage 1 through stage 4 chronic kidney disease, or unspecified chronic kidney disease: Secondary | ICD-10-CM | POA: Diagnosis not present

## 2024-09-20 DIAGNOSIS — Z803 Family history of malignant neoplasm of breast: Secondary | ICD-10-CM | POA: Insufficient documentation

## 2024-09-20 DIAGNOSIS — Z7982 Long term (current) use of aspirin: Secondary | ICD-10-CM | POA: Insufficient documentation

## 2024-09-20 DIAGNOSIS — Z79899 Other long term (current) drug therapy: Secondary | ICD-10-CM | POA: Insufficient documentation

## 2024-09-20 DIAGNOSIS — Z7985 Long-term (current) use of injectable non-insulin antidiabetic drugs: Secondary | ICD-10-CM | POA: Insufficient documentation

## 2024-09-20 DIAGNOSIS — Z923 Personal history of irradiation: Secondary | ICD-10-CM | POA: Diagnosis not present

## 2024-09-20 DIAGNOSIS — Z794 Long term (current) use of insulin: Secondary | ICD-10-CM | POA: Insufficient documentation

## 2024-09-20 LAB — CBC WITH DIFFERENTIAL (CANCER CENTER ONLY)
Abs Immature Granulocytes: 0.01 K/uL (ref 0.00–0.07)
Basophils Absolute: 0 K/uL (ref 0.0–0.1)
Basophils Relative: 1 %
Eosinophils Absolute: 0.1 K/uL (ref 0.0–0.5)
Eosinophils Relative: 1 %
HCT: 38.7 % (ref 36.0–46.0)
Hemoglobin: 12 g/dL (ref 12.0–15.0)
Immature Granulocytes: 0 %
Lymphocytes Relative: 41 %
Lymphs Abs: 2.6 K/uL (ref 0.7–4.0)
MCH: 21 pg — ABNORMAL LOW (ref 26.0–34.0)
MCHC: 31 g/dL (ref 30.0–36.0)
MCV: 67.8 fL — ABNORMAL LOW (ref 80.0–100.0)
Monocytes Absolute: 0.4 K/uL (ref 0.1–1.0)
Monocytes Relative: 6 %
Neutro Abs: 3.2 K/uL (ref 1.7–7.7)
Neutrophils Relative %: 51 %
Platelet Count: 325 K/uL (ref 150–400)
RBC: 5.71 MIL/uL — ABNORMAL HIGH (ref 3.87–5.11)
RDW: 14.3 % (ref 11.5–15.5)
Smear Review: NORMAL
WBC Count: 6.3 K/uL (ref 4.0–10.5)
nRBC: 0 % (ref 0.0–0.2)

## 2024-09-20 LAB — CMP (CANCER CENTER ONLY)
ALT: 35 U/L (ref 0–44)
AST: 21 U/L (ref 15–41)
Albumin: 3.6 g/dL (ref 3.5–5.0)
Alkaline Phosphatase: 75 U/L (ref 38–126)
Anion gap: 9 (ref 5–15)
BUN: 23 mg/dL (ref 8–23)
CO2: 26 mmol/L (ref 22–32)
Calcium: 9.5 mg/dL (ref 8.9–10.3)
Chloride: 103 mmol/L (ref 98–111)
Creatinine: 0.95 mg/dL (ref 0.44–1.00)
GFR, Estimated: >60 mL/min (ref >60)
Glucose, Bld: 110 mg/dL — ABNORMAL HIGH (ref 70–99)
Potassium: 3.8 mmol/L (ref 3.5–5.1)
Sodium: 138 mmol/L (ref 135–145)
Total Bilirubin: 0.9 mg/dL (ref 0.0–1.2)
Total Protein: 6.7 g/dL (ref 6.5–8.1)

## 2024-09-20 LAB — VITAMIN D 25 HYDROXY (VIT D DEFICIENCY, FRACTURES): Vit D, 25-Hydroxy: 58.16 ng/mL (ref 30–100)

## 2024-09-20 NOTE — Progress Notes (Signed)
 No concerns today

## 2024-09-20 NOTE — Progress Notes (Signed)
 [facility] OFFICE PROGRESS NOTE  Patient Care Team: Sadie Manna, MD as PCP - General (Internal Medicine) Rodolph Romano, MD as Consulting Physician (General Surgery) Rennie Cindy SAUNDERS, MD as Consulting Physician (Hematology and Oncology)   Cancer Staging  No matching staging information was found for the patient.    Oncology History Overview Note  # 1996- Right breast. 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, [name], [name], [name]; [name], 2014) 7. Patient was started on Cytoxan and Taxotere January 11, 2013; Patient is on letrozole  from April of 2014  # NOV 2016- BMD- Normal.  3. BRCA negative (verbal information from [facility])  # mild anemia- ? Thalassemia- Hb ~11; [name]      ;  GOALS: cure  CURRENT/MOST RECENT THERAPY : a letrozole        Carcinoma of overlapping sites of right breast in female, estrogen receptor positive (HCC)   INTERVAL HISTORY: Ambulating independently.  Alone.  Nancy Gaines 78 y.o.  female pleasant patient above history of Breast cancer ER/PR positive HER-2/neu negative stage I-status post extended AI- Letrozole  [finished  April 2024] currently on surveillance is here for follow-up.   Discussed the use of AI scribe software for clinical note transcription with the patient, who gave verbal consent to proceed.  History of Present Illness   Nancy Gaines is a 78 year old female with breast cancer and mild anemia who presents for follow-up.  Her hemoglobin increased from 11 to 12, which she reports is her usual level.  Her blood sugar decreased from 189 to 110. She reports she has been trying to do better with her diet and has lost some weight. She aims to reach a weight of 160 pounds and is satisfied with her progress. Her last A1c was 7, three to four months ago, and she hopes for a lower result at her next check-up.  She has a history of neuropathy, with numbness in her hands and feet improving. She experienced some numbness earlier this week, which resolved on its own.  She plans to spend Thanksgiving at home as her family will be elsewhere.      Review of Systems  Constitutional:  Negative for chills, diaphoresis, fever, [name], hemoptysis, sputum production, [name], palpitations, orthopnea and leg swelling.  Gastrointestinal:  Negative for abdominal pain, blood in stool, constipation, diarrhea, heartburn, melena, nausea and vomiting.  Genitourinary:  Negative for dysuria, frequency and urgency.  Musculoskeletal:  Positive for joint pain. Negative  for back pain.  Skin: Negative.  Negative for itching and rash.  Neurological:  Negative for dizziness, tingling, focal weakness, weakness and headaches.  Endo/Heme/Allergies:  Does not bruise/bleed easily.  Psychiatric/Behavioral:  Negative for depression. The patient is not nervous/anxious and does not have insomnia.      PAST MEDICAL HISTORY  :  Past Medical History:  Diagnosis Date   Anemia    Breast cancer (HCC) 2014   right breast ca- chemo and mastectomy   Cancer (HCC) 1996   right breast cancer- radiation and lumpectomy   Degenerative joint disease    Dermatophytosis of nail    Diabetes mellitus without complication (HCC)    Diverticulosis    Hypercholesterolemia    Hypertension    Personal history of chemotherapy 2014   BREAST CA   Personal history of radiation therapy 1996   BREAST CA    PAST SURGICAL HISTORY :   Past Surgical History:  Procedure Laterality Date   ABDOMINAL HYSTERECTOMY     COLONOSCOPY WITH PROPOFOL  N/A 08/29/2015   Procedure: COLONOSCOPY WITH PROPOFOL ;  Surgeon: Deward CINDERELLA Piedmont, MD;  Location: [facility] ENDOSCOPY;  Service: Gastroenterology;  [name];   DILATION AND CURETTAGE, DIAGNOSTIC / THERAPEUTIC     MASTECTOMY Right 2014   BREAST CA    FAMILY HISTORY :   Family History  Problem Relation Age of Onset   Diabetes Sister    Breast cancer Sister 64   Breast cancer Maternal Aunt    Breast cancer Paternal Aunt     SOCIAL HISTORY:   Social History   Tobacco Use   Smoking status: Never   Smokeless tobacco: Never  Vaping Use   Vaping status: Never Used  Substance Use Topics   Alcohol use: No   Drug use: No    ALLERGIES:  is allergic to lipitor [atorvastatin].  MEDICATIONS:  Current Outpatient Medications  Medication Sig Dispense Refill   aspirin 81 MG tablet Take 81 mg by mouth daily.     Calcium Carb-Cholecalciferol 600-20 MG-MCG TABS Take 1 tablet by mouth daily.     ferrous sulfate 325 (65 FE) MG tablet Take 325 mg by mouth daily with breakfast.      glucose blood test strip      levothyroxine (SYNTHROID) 50 MCG tablet Take 50 mcg by mouth daily.     losartan-hydrochlorothiazide (HYZAAR) 100-12.5 MG tablet Take 1 tablet by mouth daily.     metFORMIN (GLUCOPHAGE) 1000 MG tablet Take 1,000 mg by mouth daily with breakfast.     Multiple Vitamin (MULTI-VITAMINS) TABS Take  by mouth.     NOVOLIN N 100 UNIT/ML injection Inject 35 Units into the skin daily before breakfast. And 20 units in the evening with dinner     OZEMPIC, 0.25 OR 0.5 MG/DOSE, 2 MG/3ML SOPN Inject into the skin.     pravastatin (PRAVACHOL) 20 MG tablet Take 1 tablet by mouth at bedtime. (Patient taking differently: Take 40 mg by mouth at bedtime.)     No current facility-administered medications for this visit.   Facility-Administered Medications Ordered in Other Visits  Medication Dose Route Frequency Provider Last Rate Last Admin   heparin  lock flush 100 unit/mL  500 Units Intravenous Once Markel Kurtenbach R, MD       heparin  lock flush 100 unit/mL  500 Units Intravenous Once Metha Kolasa R, MD       sodium chloride  flush (NS) 0.9 % injection 10 mL  10 mL Intravenous Once Benedicto Capozzi R, MD  sodium chloride  flush (NS) 0.9 % injection 10 mL  10 mL Intravenous Once Vianne Grieshop R, MD       sodium chloride  flush (NS) 0.9 % injection 10 mL  10 mL Intravenous PRN Sabien Umland R, MD        PHYSICAL EXAMINATION: ECOG PERFORMANCE STATUS: 0 - Asymptomatic  BP (!) 157/74 (BP Location: Left Arm, Patient Position: Sitting, Cuff Size: Large) Comment: pt advised bp elevated, keep check at home, contact pcp if con't to stay elevated  Pulse 75   Temp (!) 96.7 F (35.9 C) (Tympanic)   Resp 16   Ht 5' 2 (1.575 m)   Wt 189 lb 4.8 oz (85.9 kg)   SpO2 100%   BMI 34.62 kg/m   Filed Weights   09/20/24 1044  Weight: 189 lb 4.8 oz (85.9 kg)      Physical Exam HENT:     Head: Normocephalic and atraumatic.     Mouth/Throat:     Pharynx: No oropharyngeal exudate.  Eyes:     Pupils: Pupils are equal, round, and reactive to light.  Cardiovascular:     Rate and Rhythm: Normal rate and regular rhythm.  Pulmonary:     Effort: No respiratory distress.     Breath sounds: No wheezing.  Abdominal:     General: Bowel sounds are normal. There is no distension.      Palpations: Abdomen is soft. There is no mass.     Tenderness: There is no abdominal tenderness. There is no guarding or rebound.  Musculoskeletal:        General: No tenderness. Normal range of motion.     Cervical back: Normal range of motion and neck supple.  Skin:    General: Skin is warm.  Neurological:     Mental Status: She is alert and oriented to person, place, and time.  Psychiatric:        Mood and Affect: Affect normal.     .  LABORATORY DATA:  I have reviewed the data as listed    Component Value Date/Time   NA 138 09/20/2024 1033   NA 141 05/06/2014 1039   K 3.8 09/20/2024 1033   K 4.4 05/06/2014 1039   CL 103 09/20/2024 1033   CL 104 05/06/2014 1039   CO2 26 09/20/2024 1033   CO2 30 05/06/2014 1039   GLUCOSE 110 (H) 09/20/2024 1033   GLUCOSE 184 (H) 05/06/2014 1039   BUN 23 09/20/2024 1033   BUN 21 (H) 05/06/2014 1039   CREATININE 0.95 09/20/2024 1033   CREATININE 1.04 05/06/2014 1039   CALCIUM 9.5 09/20/2024 1033   CALCIUM 9.7 05/06/2014 1039   PROT 6.7 09/20/2024 1033   PROT 7.3 05/06/2014 1039   ALBUMIN 3.6 09/20/2024 1033   ALBUMIN 3.7 05/06/2014 1039   AST 21 09/20/2024 1033   ALT 35 09/20/2024 1033   ALT 63 05/06/2014 1039   ALKPHOS 75 09/20/2024 1033   ALKPHOS 102 05/06/2014 1039   BILITOT 0.9 09/20/2024 1033   GFRNONAA >60 09/20/2024 1033   GFRNONAA 56 (L) 05/06/2014 1039   GFRAA >60 06/05/2020 1012   GFRAA >60 05/06/2014 1039    No results found for: SPEP, UPEP  Lab Results  Component Value Date   WBC 6.3 09/20/2024   NEUTROABS 3.2 09/20/2024   HGB 12.0 09/20/2024   HCT 38.7 09/20/2024   MCV 67.8 (L) 09/20/2024   PLT 325 09/20/2024      Chemistry      Component Value  Date/Time   NA 138 09/20/2024 1033   NA 141 05/06/2014 1039   K 3.8 09/20/2024 1033   K 4.4 05/06/2014 1039   CL 103 09/20/2024 1033   CL 104 05/06/2014 1039   CO2 26 09/20/2024 1033   CO2 30 05/06/2014 1039   BUN 23 09/20/2024 1033   BUN 21 (H)  05/06/2014 1039   CREATININE 0.95 09/20/2024 1033   CREATININE 1.04 05/06/2014 1039      Component Value Date/Time   CALCIUM 9.5 09/20/2024 1033   CALCIUM 9.7 05/06/2014 1039   ALKPHOS 75 09/20/2024 1033   ALKPHOS 102 05/06/2014 1039   AST 21 09/20/2024 1033   ALT 35 09/20/2024 1033   ALT 63 05/06/2014 1039   BILITOT 0.9 09/20/2024 1033      RADIOGRAPHIC STUDIES: I have personally reviewed the radiological images as listed and agreed with the findings in the report. No results found.   ASSESSMENT & PLAN:  Carcinoma of overlapping sites of right breast in female, estrogen receptor positive (HCC) # RIGHT BREAST IPISILATERAL RECURRENCE- STAGE I Breast cancer ER/PR positive HER-2/neu negative- currently s/p Ext.AI- letrozole  [finished 2024-].  stable    # Clinically no evidence of recurrence.  UNI LAT LEFT mammo-FEB 2025 -wnl. stable   # Microcytic anemia- hemoglobin 10-11-suspect thalassemia.  Asymptomatic; stable   # BMD- T-score of -0.1.  [FEB 2024]- Continue calcium plus vitamin D -stable   # DM- BG 118 fasting; HbA1C- 10.1 [April 2023] - on ozempic- improved/ stable- Encouraged the patient to keep losing weight-  stable   # DISPOSITION: # UNI LAT LEFT mammo-FEB 2026.  # follow up in 6  months-  -MD; labs-cbc/cmp; vit D 25-OH-;Dr.B    Orders Placed This Encounter  Procedures   CBC with Differential (Cancer Center Only)    Standing Status:   Future    Expected Date:   03/22/2025    Expiration Date:   06/20/2025   CMP (Cancer Center only)    Standing Status:   Future    Expected Date:   03/22/2025    Expiration Date:   06/20/2025   VITAMIN D  25 Hydroxy (Vit-D Deficiency, Fractures)    Standing Status:   Future    Expected Date:   03/22/2025    Expiration Date:   06/20/2025   All questions were answered. The patient knows to call the clinic with any problems, questions or concerns.     Cindy JONELLE Joe, MD 09/20/2024 11:27 AM

## 2024-10-08 ENCOUNTER — Other Ambulatory Visit: Payer: Self-pay

## 2024-10-08 ENCOUNTER — Other Ambulatory Visit (HOSPITAL_COMMUNITY): Payer: Self-pay

## 2024-10-08 MED ORDER — LOSARTAN POTASSIUM-HCTZ 100-12.5 MG PO TABS
1.0000 | ORAL_TABLET | Freq: Every day | ORAL | 1 refills | Status: AC
  Filled 2024-10-08: qty 90, 90d supply, fill #0
  Filled 2024-11-05: qty 30, 30d supply, fill #0
  Filled 2024-11-06 – 2024-12-03 (×2): qty 90, 90d supply, fill #0

## 2024-10-09 ENCOUNTER — Other Ambulatory Visit: Payer: Self-pay

## 2024-10-09 ENCOUNTER — Other Ambulatory Visit (HOSPITAL_COMMUNITY): Payer: Self-pay

## 2024-10-09 MED ORDER — METFORMIN HCL 1000 MG PO TABS
1000.0000 mg | ORAL_TABLET | Freq: Every day | ORAL | 1 refills | Status: AC
  Filled 2024-10-09 – 2024-11-05 (×3): qty 30, 30d supply, fill #0
  Filled 2024-12-03: qty 30, 30d supply, fill #1

## 2024-10-09 MED ORDER — EUTHYROX 50 MCG PO TABS
50.0000 ug | ORAL_TABLET | Freq: Every morning | ORAL | 1 refills | Status: DC
  Filled 2024-10-09 – 2024-10-16 (×2): qty 90, 90d supply, fill #0
  Filled 2024-11-05: qty 30, 30d supply, fill #0

## 2024-10-09 MED ORDER — PRAVASTATIN SODIUM 40 MG PO TABS
40.0000 mg | ORAL_TABLET | Freq: Every day | ORAL | 1 refills | Status: AC
  Filled 2024-10-09: qty 90, 90d supply, fill #0
  Filled 2024-10-16 – 2024-11-05 (×2): qty 30, 30d supply, fill #0
  Filled 2024-12-03: qty 30, 30d supply, fill #1

## 2024-10-10 ENCOUNTER — Other Ambulatory Visit (HOSPITAL_COMMUNITY): Payer: Self-pay

## 2024-10-10 MED ORDER — NA SULFATE-K SULFATE-MG SULF 17.5-3.13-1.6 GM/177ML PO SOLN
ORAL | 0 refills | Status: AC
  Filled 2024-10-10 – 2024-11-08 (×2): qty 354, 1d supply, fill #0

## 2024-10-11 ENCOUNTER — Other Ambulatory Visit (HOSPITAL_COMMUNITY): Payer: Self-pay

## 2024-10-11 ENCOUNTER — Other Ambulatory Visit: Payer: Self-pay

## 2024-10-12 ENCOUNTER — Other Ambulatory Visit (HOSPITAL_COMMUNITY): Payer: Self-pay

## 2024-10-12 ENCOUNTER — Other Ambulatory Visit: Payer: Self-pay

## 2024-10-16 ENCOUNTER — Other Ambulatory Visit: Payer: Self-pay

## 2024-11-05 ENCOUNTER — Other Ambulatory Visit: Payer: Self-pay

## 2024-11-05 ENCOUNTER — Other Ambulatory Visit (HOSPITAL_COMMUNITY): Payer: Self-pay

## 2024-11-05 MED ORDER — LEVOTHYROXINE SODIUM 50 MCG PO TABS
50.0000 ug | ORAL_TABLET | Freq: Every day | ORAL | 11 refills | Status: AC
  Filled 2024-11-05: qty 30, 30d supply, fill #0
  Filled 2024-12-03: qty 30, 30d supply, fill #1

## 2024-11-06 ENCOUNTER — Ambulatory Visit: Payer: Self-pay | Admitting: Pharmacist

## 2024-11-06 ENCOUNTER — Other Ambulatory Visit: Payer: Self-pay

## 2024-11-06 MED ORDER — DONEPEZIL HCL 5 MG PO TABS
5.0000 mg | ORAL_TABLET | Freq: Every evening | ORAL | 3 refills | Status: AC
  Filled 2024-11-06: qty 30, 30d supply, fill #0
  Filled 2024-12-03: qty 30, 30d supply, fill #1

## 2024-11-08 ENCOUNTER — Other Ambulatory Visit: Payer: Self-pay

## 2024-11-08 ENCOUNTER — Ambulatory Visit: Payer: Self-pay | Admitting: Pharmacist

## 2024-11-27 ENCOUNTER — Emergency Department

## 2024-11-27 ENCOUNTER — Emergency Department
Admission: EM | Admit: 2024-11-27 | Discharge: 2024-11-28 | Disposition: A | Attending: Emergency Medicine | Admitting: Emergency Medicine

## 2024-11-27 DIAGNOSIS — R197 Diarrhea, unspecified: Secondary | ICD-10-CM | POA: Insufficient documentation

## 2024-11-27 DIAGNOSIS — R112 Nausea with vomiting, unspecified: Secondary | ICD-10-CM | POA: Insufficient documentation

## 2024-11-27 DIAGNOSIS — E119 Type 2 diabetes mellitus without complications: Secondary | ICD-10-CM | POA: Insufficient documentation

## 2024-11-27 DIAGNOSIS — Z853 Personal history of malignant neoplasm of breast: Secondary | ICD-10-CM | POA: Insufficient documentation

## 2024-11-27 DIAGNOSIS — E875 Hyperkalemia: Secondary | ICD-10-CM | POA: Insufficient documentation

## 2024-11-27 DIAGNOSIS — D72829 Elevated white blood cell count, unspecified: Secondary | ICD-10-CM | POA: Insufficient documentation

## 2024-11-27 DIAGNOSIS — R42 Dizziness and giddiness: Secondary | ICD-10-CM | POA: Insufficient documentation

## 2024-11-27 LAB — CBC WITH DIFFERENTIAL/PLATELET
Abs Immature Granulocytes: 0.07 10*3/uL (ref 0.00–0.07)
Basophils Absolute: 0 10*3/uL (ref 0.0–0.1)
Basophils Relative: 0 %
Eosinophils Absolute: 0 10*3/uL (ref 0.0–0.5)
Eosinophils Relative: 0 %
HCT: 40.1 % (ref 36.0–46.0)
Hemoglobin: 12.3 g/dL (ref 12.0–15.0)
Immature Granulocytes: 1 %
Lymphocytes Relative: 17 %
Lymphs Abs: 1.8 10*3/uL (ref 0.7–4.0)
MCH: 21.2 pg — ABNORMAL LOW (ref 26.0–34.0)
MCHC: 30.7 g/dL (ref 30.0–36.0)
MCV: 69.1 fL — ABNORMAL LOW (ref 80.0–100.0)
Monocytes Absolute: 0.3 10*3/uL (ref 0.1–1.0)
Monocytes Relative: 3 %
Neutro Abs: 8.8 10*3/uL — ABNORMAL HIGH (ref 1.7–7.7)
Neutrophils Relative %: 79 %
Platelets: 355 10*3/uL (ref 150–400)
RBC: 5.8 MIL/uL — ABNORMAL HIGH (ref 3.87–5.11)
RDW: 15 % (ref 11.5–15.5)
Smear Review: NORMAL
WBC: 11.1 10*3/uL — ABNORMAL HIGH (ref 4.0–10.5)
nRBC: 0 % (ref 0.0–0.2)

## 2024-11-27 LAB — BASIC METABOLIC PANEL WITH GFR
Anion gap: 12 (ref 5–15)
BUN: 16 mg/dL (ref 8–23)
CO2: 27 mmol/L (ref 22–32)
Calcium: 10.8 mg/dL — ABNORMAL HIGH (ref 8.9–10.3)
Chloride: 105 mmol/L (ref 98–111)
Creatinine, Ser: 0.82 mg/dL (ref 0.44–1.00)
GFR, Estimated: >60 mL/min
Glucose, Bld: 146 mg/dL — ABNORMAL HIGH (ref 70–99)
Potassium: 5.2 mmol/L — ABNORMAL HIGH (ref 3.5–5.1)
Sodium: 144 mmol/L (ref 135–145)

## 2024-11-27 LAB — URINALYSIS, W/ REFLEX TO CULTURE (INFECTION SUSPECTED)
Bacteria, UA: NONE SEEN
Bilirubin Urine: NEGATIVE
Glucose, UA: NEGATIVE mg/dL
Hgb urine dipstick: NEGATIVE
Ketones, ur: 5 mg/dL — AB
Leukocytes,Ua: NEGATIVE
Nitrite: NEGATIVE
Protein, ur: 30 mg/dL — AB
Specific Gravity, Urine: 1.017 (ref 1.005–1.030)
pH: 8 (ref 5.0–8.0)

## 2024-11-27 LAB — APTT: aPTT: 25 s (ref 24–36)

## 2024-11-27 LAB — MAGNESIUM: Magnesium: 1.9 mg/dL (ref 1.7–2.4)

## 2024-11-27 LAB — PROTIME-INR
INR: 0.9 (ref 0.8–1.2)
Prothrombin Time: 12.6 s (ref 11.4–15.2)

## 2024-11-27 LAB — TROPONIN T, HIGH SENSITIVITY: Troponin T High Sensitivity: 10 ng/L (ref 0–19)

## 2024-11-27 MED ORDER — SODIUM CHLORIDE 0.9 % IV BOLUS
1000.0000 mL | Freq: Once | INTRAVENOUS | Status: AC
  Administered 2024-11-27: 1000 mL via INTRAVENOUS

## 2024-11-27 MED ORDER — ONDANSETRON HCL 4 MG/2ML IJ SOLN
4.0000 mg | Freq: Once | INTRAMUSCULAR | Status: AC
  Administered 2024-11-27: 4 mg via INTRAVENOUS
  Filled 2024-11-27: qty 2

## 2024-11-27 MED ORDER — MECLIZINE HCL 25 MG PO TABS
50.0000 mg | ORAL_TABLET | Freq: Once | ORAL | Status: AC
  Administered 2024-11-27: 50 mg via ORAL
  Filled 2024-11-27: qty 2

## 2024-11-27 NOTE — ED Triage Notes (Signed)
 First nurse note: pt to ED [facility] for dizziness started this am. Reports cbg 89. +emesis/diarrhea.

## 2024-11-27 NOTE — ED Notes (Signed)
"  Pt transported to MRI at this time.   "

## 2024-11-27 NOTE — ED Provider Notes (Signed)
 [facility] Provider Note    Event Date/Time   First MD Initiated Contact with Patient 11/27/24 2008     (approximate)   History   Dizziness, Emesis, and Diarrhea   HPI  Nancy Gaines is a 79 y.o. female with history of diabetes, history of breast cancer, completed letrozole , currently on surveillance, presenting with nausea vomiting diarrhea.  States symptoms this morning.  Last known well was around midnight when she went to sleep.  States that she was in her usual state of health.  Woke up today with dizziness and room spinning.  Had nausea vomiting diarrhea twice.  States that dizziness is intermittent.  Denies any headache, no chest pain shortness of breath, no fever or urinary symptoms.  Denies recent antibiotic use or hospitalizations.  No prior history of stroke.  Denies focal weakness or numbness, no vision changes, no slurred speech or facial droop.  Denies tinnitus or ear symptoms.  On independent review, she was seen by oncology in November of last year, was there for follow-up, no clinical evidence of recurrence per oncology.  Is on Ozempic for her diabetes.     Physical Exam   Triage Vital Signs: ED Triage Vitals  Encounter Vitals Group     BP 11/27/24 1611 (!) 174/90     Girls Systolic BP Percentile --      Girls Diastolic BP Percentile --      Boys Systolic BP Percentile --      Boys Diastolic BP Percentile --      Pulse Rate 11/27/24 1611 72     Resp 11/27/24 1611 16     Temp 11/27/24 1611 98.4 F (36.9 C)     Temp Source 11/27/24 1611 Oral     SpO2 11/27/24 1611 100 %     Weight 11/27/24 1609 189 lb 9.5 oz (86 kg)     Height 11/27/24 1609 5' 2 (1.575 m)     Head Circumference --      Peak Flow --      Pain Score 11/27/24 1609 0     Pain Loc --      Pain Education --      Exclude from Growth Chart --     Most recent vital signs: Vitals:   11/27/24 2143 11/27/24 2230  BP: (!) 187/91 (!) 164/89  Pulse: 78 80  Resp: 16 19   Temp:    SpO2: 100% 100%     General: Awake, no distress.  CV:  Good peripheral perfusion.  Resp:  Normal effort.  Abd:  No distention.  Soft nontender Other:  Pupils are equal and reactive, extraocular movements are intact, no facial droop or slurred speech, no focal weakness or numbness.  She is ataxic when getting up to walk.   ED Results / Procedures / Treatments   Labs (all labs ordered are listed, but only abnormal results are displayed) Labs Reviewed  CBC WITH DIFFERENTIAL/PLATELET - Abnormal; Notable for the following components:      Result Value   WBC 11.1 (*)    RBC 5.80 (*)    MCV 69.1 (*)    MCH 21.2 (*)    Neutro Abs 8.8 (*)    All other components within normal limits  BASIC METABOLIC PANEL WITH GFR - Abnormal; Notable for the following components:   Potassium 5.2 (*)    Glucose, Bld 146 (*)    Calcium 10.8 (*)    All other components within normal  limits  URINALYSIS, W/ REFLEX TO CULTURE (INFECTION SUSPECTED) - Abnormal; Notable for the following components:   Color, Urine YELLOW (*)    APPearance HAZY (*)    Ketones, ur 5 (*)    Protein, ur 30 (*)    All other components within normal limits  MAGNESIUM  PROTIME-INR  APTT  CBG MONITORING, ED  TROPONIN T, HIGH SENSITIVITY     EKG  EKG shows, sinus rhythm, rate 63, normal QS, normal QTc, no obvious ischemic ST elevation, T wave inversion to 3, no prior to compare   RADIOLOGY On my independent interpretation, CT head without obvious intracranial hemorrhage   PROCEDURES:  Critical Care performed: No  Procedures   MEDICATIONS ORDERED IN ED: Medications  sodium chloride  0.9 % bolus 1,000 mL (1,000 mLs Intravenous New Bag/Given 11/27/24 2116)  ondansetron  (ZOFRAN ) injection 4 mg (4 mg Intravenous Given 11/27/24 2112)  meclizine  (ANTIVERT ) tablet 50 mg (50 mg Oral Given 11/27/24 2103)     IMPRESSION / MDM / ASSESSMENT AND PLAN / ED COURSE  I reviewed the triage vital signs and the nursing  notes.                              Differential diagnosis includes, but is not limited to, electrolyte derangements, dehydration, gastroenteritis, viral illness, atypical ACS, did consider CVA, BPPV.  No abdominal tenderness on exam suggest colitis or diverticulitis at this time.  She denies any recent antibiotic use or hospitalization to suggest C. difficile.  Get labs, UA, CT, MRI.  Stroke alert was not activated given that she is outside the TNK window.  Will give her some fluids, antiemetics, meclizine .  Reassess.  Patient's presentation is most consistent with acute presentation with potential threat to life or bodily function.  Independent interpretation of labs and imaging below.  Patient signed out pending MRI results, if negative for stroke and symptomatically better, likely outpatient management.    Clinical Course as of 11/27/24 2307  Tue Nov 27, 2024  2010 CT Head Wo Contrast 1. No acute intracranial abnormality.  [TT]  2204 Independent review labs, troponins not elevated, magnesium is normal, mild hyperkalemia, rest of electrolytes not severely deranged, creatinine is normal, mild leukocytosis but she has no other infectious symptoms.  UA not consistent with UTI [TT]    Clinical Course User Index [TT] Waymond Lorelle Cummins, MD     FINAL CLINICAL IMPRESSION(S) / ED DIAGNOSES   Final diagnoses:  Nausea vomiting and diarrhea  Dizziness     Rx / DC Orders   ED Discharge Orders     None        Note:  This document was prepared using Dragon voice recognition software and may include unintentional dictation errors.    Waymond Lorelle Cummins, MD 11/27/24 [phone]

## 2024-11-27 NOTE — ED Triage Notes (Signed)
 Pt c/o vomiting and diarrhea and dizziness since this morning. Pt denies pain. Pt woke up with the room spinning and it precipitated n/v. Pt laid down and it was worse. Pt reports prior to arrival, movement exacerbated sx.

## 2024-11-27 NOTE — Progress Notes (Signed)
 Chief Complaint Chief Complaint Dizziness X 1/27 Pt woke up this AM, felt dizzy, checked her blood sugar and it was 89 States she made breakfast and drank juice, and then took her insulin States she laid back down for a while, got up and said the room was spinning Having n/v/d multiple times, last vomiting was an hour ago Denies HA, ear pain  Sent to ed for eval

## 2024-11-27 NOTE — ED Notes (Signed)
Pt ambulatory to restroom. Urine sample collected.

## 2024-11-28 MED ORDER — MECLIZINE HCL 25 MG PO TABS: 25.0000 mg | ORAL_TABLET | Freq: Three times a day (TID) | ORAL | 1 refills | Status: AC | PRN

## 2024-11-28 NOTE — ED Provider Notes (Signed)
 Procedures  Clinical Course as of 11/28/24 0209  Tue Nov 27, 2024  2010 CT Head Wo Contrast 1. No acute intracranial abnormality.  [TT]  2204 Independent review labs, troponins not elevated, magnesium is normal, mild hyperkalemia, rest of electrolytes not severely deranged, creatinine is normal, mild leukocytosis but she has no other infectious symptoms.  UA not consistent with UTI [TT]    Clinical Course User Index [TT] Waymond Lorelle Cummins, MD    ----------------------------------------- 2:09 AM on 11/28/2024 -----------------------------------------   Mri normal. Ambulatory and steady. Stable for DC.   Viviann Pastor, MD 11/28/24 [phone]

## 2024-11-28 NOTE — ED Notes (Signed)
 PT ambulated in room with minimal assistance

## 2024-11-28 NOTE — ED Notes (Signed)
 Pt verbalizes understanding of discharge instructions. Opportunity for questioning and answers were provided. Pt discharged from ED with family.

## 2024-12-03 ENCOUNTER — Other Ambulatory Visit: Payer: Self-pay

## 2024-12-04 ENCOUNTER — Ambulatory Visit: Admitting: Certified Registered"

## 2024-12-04 ENCOUNTER — Ambulatory Visit
Admission: RE | Admit: 2024-12-04 | Discharge: 2024-12-04 | Disposition: A | Attending: Gastroenterology | Admitting: Gastroenterology

## 2024-12-04 ENCOUNTER — Encounter: Admission: RE | Disposition: A | Payer: Self-pay | Source: Home / Self Care | Attending: Gastroenterology

## 2024-12-04 ENCOUNTER — Other Ambulatory Visit: Payer: Self-pay

## 2024-12-04 ENCOUNTER — Encounter: Admission: RE | Payer: Self-pay

## 2024-12-04 DIAGNOSIS — Z79890 Hormone replacement therapy (postmenopausal): Secondary | ICD-10-CM | POA: Insufficient documentation

## 2024-12-04 DIAGNOSIS — Z7984 Long term (current) use of oral hypoglycemic drugs: Secondary | ICD-10-CM | POA: Insufficient documentation

## 2024-12-04 DIAGNOSIS — K5730 Diverticulosis of large intestine without perforation or abscess without bleeding: Secondary | ICD-10-CM | POA: Insufficient documentation

## 2024-12-04 DIAGNOSIS — Z1211 Encounter for screening for malignant neoplasm of colon: Secondary | ICD-10-CM | POA: Insufficient documentation

## 2024-12-04 DIAGNOSIS — E669 Obesity, unspecified: Secondary | ICD-10-CM | POA: Insufficient documentation

## 2024-12-04 DIAGNOSIS — Z794 Long term (current) use of insulin: Secondary | ICD-10-CM | POA: Insufficient documentation

## 2024-12-04 DIAGNOSIS — K644 Residual hemorrhoidal skin tags: Secondary | ICD-10-CM | POA: Insufficient documentation

## 2024-12-04 DIAGNOSIS — K641 Second degree hemorrhoids: Secondary | ICD-10-CM | POA: Insufficient documentation

## 2024-12-04 DIAGNOSIS — I10 Essential (primary) hypertension: Secondary | ICD-10-CM | POA: Insufficient documentation

## 2024-12-04 DIAGNOSIS — E039 Hypothyroidism, unspecified: Secondary | ICD-10-CM | POA: Insufficient documentation

## 2024-12-04 DIAGNOSIS — D122 Benign neoplasm of ascending colon: Secondary | ICD-10-CM | POA: Insufficient documentation

## 2024-12-04 DIAGNOSIS — E119 Type 2 diabetes mellitus without complications: Secondary | ICD-10-CM | POA: Insufficient documentation

## 2024-12-04 DIAGNOSIS — D120 Benign neoplasm of cecum: Secondary | ICD-10-CM | POA: Insufficient documentation

## 2024-12-04 LAB — GLUCOSE, CAPILLARY: Glucose-Capillary: 72 mg/dL (ref 70–99)

## 2024-12-04 MED ORDER — PROPOFOL 500 MG/50ML IV EMUL
INTRAVENOUS | Status: DC | PRN
  Administered 2024-12-04: 165 ug/kg/min via INTRAVENOUS

## 2024-12-04 MED ORDER — PHENYLEPHRINE 80 MCG/ML (10ML) SYRINGE FOR IV PUSH (FOR BLOOD PRESSURE SUPPORT)
PREFILLED_SYRINGE | INTRAVENOUS | Status: DC | PRN
  Administered 2024-12-04: 80 ug via INTRAVENOUS

## 2024-12-04 MED ORDER — LIDOCAINE HCL (CARDIAC) PF 100 MG/5ML IV SOSY
PREFILLED_SYRINGE | INTRAVENOUS | Status: DC | PRN
  Administered 2024-12-04: 100 mg via INTRAVENOUS

## 2024-12-04 MED ORDER — GLYCOPYRROLATE 0.2 MG/ML IJ SOLN
INTRAMUSCULAR | Status: DC | PRN
  Administered 2024-12-04: .2 mg via INTRAVENOUS

## 2024-12-04 MED ORDER — EPHEDRINE SULFATE-NACL 50-0.9 MG/10ML-% IV SOSY
PREFILLED_SYRINGE | INTRAVENOUS | Status: DC | PRN
  Administered 2024-12-04: 5 mg via INTRAVENOUS

## 2024-12-04 MED ORDER — SODIUM CHLORIDE 0.9 % IV SOLN: INTRAVENOUS | Status: DC

## 2024-12-04 MED ORDER — PROPOFOL 10 MG/ML IV BOLUS
INTRAVENOUS | Status: DC | PRN
  Administered 2024-12-04: 60 mg via INTRAVENOUS

## 2024-12-04 NOTE — Anesthesia Procedure Notes (Addendum)
 Procedure Name: General with mask airway Date/Time: 12/04/2024 10:33 AM  Performed by: Ledora Duncan, CRNAPre-anesthesia Checklist: Patient identified, Emergency Drugs available, Suction available and Patient being monitored Patient Re-evaluated:Patient Re-evaluated prior to induction Oxygen Delivery Method: Simple face mask Induction Type: IV induction Placement Confirmation: positive ETCO2 and breath sounds checked- equal and bilateral Dental Injury: Teeth and Oropharynx as per pre-operative assessment

## 2024-12-04 NOTE — Anesthesia Preprocedure Evaluation (Signed)
 "                                  Anesthesia Evaluation  Patient identified by MRN, date of birth, ID band Patient awake    Reviewed: Allergy & Precautions, H&P , NPO status , Patient's Chart, lab work & pertinent test results, reviewed documented beta blocker date and time   History of Anesthesia Complications Negative for: history of anesthetic complications  Airway Mallampati: III  TM Distance: >3 FB Neck ROM: full    Dental  (+) Teeth Intact, Dental Advidsory Given   Pulmonary neg pulmonary ROS   Pulmonary exam normal breath sounds clear to auscultation       Cardiovascular Exercise Tolerance: Good hypertension, On Medications (-) angina (-) CAD, (-) Past MI, (-) Cardiac Stents and (-) CABG Normal cardiovascular exam(-) dysrhythmias (-) Valvular Problems/Murmurs Rhythm:regular Rate:Normal     Neuro/Psych negative neurological ROS  negative psych ROS   GI/Hepatic negative GI ROS, Neg liver ROS,,,  Endo/Other  diabetes, Well Controlled, Oral Hypoglycemic AgentsHypothyroidism    Renal/GU negative Renal ROS  negative genitourinary   Musculoskeletal   Abdominal   Peds  Hematology  (+) Blood dyscrasia, anemia   Anesthesia Other Findings Past Medical History:   Anemia                                                       Dermatophytosis of nail                                      Diverticulosis                                               Degenerative joint disease                                   Hypertension                                                 Cancer (HCC)                                                   Comment:breast cancer   Hypercholesterolemia                                         Diabetes mellitus without complication (HCC)                 Reproductive/Obstetrics negative OB ROS  Anesthesia Physical Anesthesia Plan  ASA: 3  Anesthesia Plan: General   Post-op  Pain Management:    Induction: Intravenous  PONV Risk Score and Plan: 3 and Propofol  infusion, TIVA and Treatment may vary due to age or medical condition  Airway Management Planned: Natural Airway and Nasal Cannula  Additional Equipment:   Intra-op Plan:   Post-operative Plan:   Informed Consent: I have reviewed the patients History and Physical, chart, labs and discussed the procedure including the risks, benefits and alternatives for the proposed anesthesia with the patient or authorized representative who has indicated his/her understanding and acceptance.     Dental Advisory Given  Plan Discussed with: Anesthesiologist, CRNA and Surgeon  Anesthesia Plan Comments:          Anesthesia Quick Evaluation  "

## 2024-12-04 NOTE — Interval H&P Note (Signed)
 History and Physical Interval Note:  12/04/2024 10:30 AM  Nancy Gaines  has presented today for surgery, with the diagnosis of Colon cancer screening (Z12.11).  The various methods of treatment have been discussed with the patient and family. After consideration of risks, benefits and other options for treatment, the patient has consented to  Procedures: COLONOSCOPY (N/A) as a surgical intervention.  The patient's history has been reviewed, patient examined, no change in status, stable for surgery.  I have reviewed the patient's chart and labs.  Questions were answered to the patient's satisfaction.     Nancy Gaines  Ok to proceed with colonoscopy

## 2024-12-05 LAB — SURGICAL PATHOLOGY

## 2024-12-28 ENCOUNTER — Encounter

## 2025-03-22 ENCOUNTER — Inpatient Hospital Stay: Admitting: Internal Medicine

## 2025-03-22 ENCOUNTER — Inpatient Hospital Stay
